# Patient Record
Sex: Female | Born: 1935 | Race: White | Hispanic: No | Smoking: Never smoker
Health system: Southern US, Community
[De-identification: ages and names within clinical notes are randomized; demographics above are authoritative.]

## PROBLEM LIST (undated history)

## (undated) DIAGNOSIS — I499 Cardiac arrhythmia, unspecified: Secondary | ICD-10-CM

## (undated) DIAGNOSIS — Z87442 Personal history of urinary calculi: Secondary | ICD-10-CM

## (undated) DIAGNOSIS — F32A Depression, unspecified: Secondary | ICD-10-CM

## (undated) DIAGNOSIS — Z950 Presence of cardiac pacemaker: Secondary | ICD-10-CM

## (undated) DIAGNOSIS — Z952 Presence of prosthetic heart valve: Secondary | ICD-10-CM

## (undated) DIAGNOSIS — F419 Anxiety disorder, unspecified: Secondary | ICD-10-CM

## (undated) DIAGNOSIS — G629 Polyneuropathy, unspecified: Secondary | ICD-10-CM

## (undated) DIAGNOSIS — G610 Guillain-Barre syndrome: Secondary | ICD-10-CM

## (undated) DIAGNOSIS — F329 Major depressive disorder, single episode, unspecified: Secondary | ICD-10-CM

## (undated) DIAGNOSIS — G43909 Migraine, unspecified, not intractable, without status migrainosus: Secondary | ICD-10-CM

## (undated) DIAGNOSIS — I10 Essential (primary) hypertension: Secondary | ICD-10-CM

## (undated) DIAGNOSIS — I359 Nonrheumatic aortic valve disorder, unspecified: Secondary | ICD-10-CM

## (undated) DIAGNOSIS — I509 Heart failure, unspecified: Secondary | ICD-10-CM

## (undated) DIAGNOSIS — I38 Endocarditis, valve unspecified: Secondary | ICD-10-CM

## (undated) DIAGNOSIS — E119 Type 2 diabetes mellitus without complications: Secondary | ICD-10-CM

## (undated) DIAGNOSIS — R0600 Dyspnea, unspecified: Secondary | ICD-10-CM

## (undated) DIAGNOSIS — E785 Hyperlipidemia, unspecified: Secondary | ICD-10-CM

## (undated) DIAGNOSIS — E039 Hypothyroidism, unspecified: Secondary | ICD-10-CM

## (undated) HISTORY — PX: INSERT / REPLACE / REMOVE PACEMAKER: SUR710

## (undated) HISTORY — PX: EYE SURGERY: SHX253

## (undated) HISTORY — PX: PACEMAKER INSERTION: SHX728

## (undated) HISTORY — PX: CARDIAC SURGERY: SHX584

## (undated) HISTORY — PX: ABDOMINAL HYSTERECTOMY: SHX81

## (undated) HISTORY — PX: CARDIAC VALVE REPLACEMENT: SHX585

## (undated) HISTORY — PX: TONSILLECTOMY: SUR1361

## (undated) HISTORY — PX: CATARACT EXTRACTION: SUR2

---

## 2000-01-18 DIAGNOSIS — G610 Guillain-Barre syndrome: Secondary | ICD-10-CM

## 2000-01-18 HISTORY — DX: Guillain-Barre syndrome: G61.0

## 2010-04-19 HISTORY — PX: PACEMAKER INSERTION: SHX728

## 2014-10-31 ENCOUNTER — Emergency Department: Payer: Medicare Other

## 2014-10-31 ENCOUNTER — Emergency Department
Admission: EM | Admit: 2014-10-31 | Discharge: 2014-10-31 | Disposition: A | Discharge status: Home / Self Care | Payer: Medicare Other | Attending: Emergency Medicine | Admitting: Emergency Medicine

## 2014-10-31 ENCOUNTER — Encounter: Payer: Self-pay | Admitting: Emergency Medicine

## 2014-10-31 DIAGNOSIS — Z880 Allergy status to penicillin: Secondary | ICD-10-CM | POA: Insufficient documentation

## 2014-10-31 DIAGNOSIS — J40 Bronchitis, not specified as acute or chronic: Secondary | ICD-10-CM | POA: Diagnosis not present

## 2014-10-31 DIAGNOSIS — Z79899 Other long term (current) drug therapy: Secondary | ICD-10-CM | POA: Diagnosis not present

## 2014-10-31 DIAGNOSIS — R05 Cough: Secondary | ICD-10-CM | POA: Diagnosis present

## 2014-10-31 DIAGNOSIS — E119 Type 2 diabetes mellitus without complications: Secondary | ICD-10-CM | POA: Diagnosis not present

## 2014-10-31 DIAGNOSIS — Z7901 Long term (current) use of anticoagulants: Secondary | ICD-10-CM | POA: Diagnosis not present

## 2014-10-31 HISTORY — DX: Type 2 diabetes mellitus without complications: E11.9

## 2014-10-31 LAB — COMPREHENSIVE METABOLIC PANEL
ALBUMIN: 4.3 g/dL (ref 3.5–5.0)
ALT: 23 U/L (ref 14–54)
AST: 25 U/L (ref 15–41)
Alkaline Phosphatase: 104 U/L (ref 38–126)
Anion gap: 6 (ref 5–15)
BUN: 15 mg/dL (ref 6–20)
CO2: 29 mmol/L (ref 22–32)
CREATININE: 0.89 mg/dL (ref 0.44–1.00)
Calcium: 9.2 mg/dL (ref 8.9–10.3)
Chloride: 104 mmol/L (ref 101–111)
GFR calc non Af Amer: >60 mL/min (ref >60)
GLUCOSE: 127 mg/dL — AB (ref 65–99)
Potassium: 4 mmol/L (ref 3.5–5.1)
SODIUM: 139 mmol/L (ref 135–145)
TOTAL PROTEIN: 7.5 g/dL (ref 6.5–8.1)
Total Bilirubin: 1.2 mg/dL (ref 0.3–1.2)

## 2014-10-31 LAB — BRAIN NATRIURETIC PEPTIDE: B Natriuretic Peptide: 189 pg/mL — ABNORMAL HIGH (ref 0.0–100.0)

## 2014-10-31 LAB — CBC
HEMATOCRIT: 40.2 % (ref 35.0–47.0)
Hemoglobin: 13.5 g/dL (ref 12.0–16.0)
MCH: 30.9 pg (ref 26.0–34.0)
MCHC: 33.7 g/dL (ref 32.0–36.0)
MCV: 91.6 fL (ref 80.0–100.0)
PLATELETS: 149 10*3/uL — AB (ref 150–440)
RBC: 4.39 MIL/uL (ref 3.80–5.20)
RDW: 16.5 % — ABNORMAL HIGH (ref 11.5–14.5)
WBC: 8 10*3/uL (ref 3.6–11.0)

## 2014-10-31 LAB — TROPONIN I: Troponin I: 0.05 ng/mL — ABNORMAL HIGH (ref <0.031)

## 2014-10-31 MED ORDER — ALBUTEROL SULFATE (2.5 MG/3ML) 0.083% IN NEBU
2.5000 mg | INHALATION_SOLUTION | Freq: Once | RESPIRATORY_TRACT | Status: AC
  Administered 2014-10-31: 2.5 mg via RESPIRATORY_TRACT
  Filled 2014-10-31: qty 3

## 2014-10-31 MED ORDER — ALBUTEROL SULFATE HFA 108 (90 BASE) MCG/ACT IN AERS: 2.0000 | INHALATION_SPRAY | RESPIRATORY_TRACT | Status: DC | PRN

## 2014-10-31 MED ORDER — PREDNISONE 50 MG PO TABS: 50.0000 mg | ORAL_TABLET | Freq: Every day | ORAL | Status: AC

## 2014-10-31 MED ORDER — IPRATROPIUM-ALBUTEROL 0.5-2.5 (3) MG/3ML IN SOLN
3.0000 mL | Freq: Once | RESPIRATORY_TRACT | Status: AC
  Administered 2014-10-31: 3 mL via RESPIRATORY_TRACT
  Filled 2014-10-31: qty 3

## 2014-10-31 NOTE — ED Provider Notes (Signed)
[facility] Emergency Department Provider Note  ____________________________________________  Time seen: On arrival  I have reviewed the triage vital signs and the nursing notes.   HISTORY  Chief Complaint Cough    HPI Jodi Waters is a 80 y.o. female who complains of cough for approximately one week. It is most severe at night. And sometimes leaves her feeling more short of breath than her baseline. She reports she went to urgent care several days ago and they gave her a shot of steroid's and azithromycin which may have briefly improved her symptoms but now they've returned. She denies chest pain. She denies pleurisy. She denies leg swelling. She denies fevers or chills. No nausea no vomiting, no diaphoresis.     Past Medical History  Diagnosis Date  . Diabetes mellitus without complication     There are no active problems to display for this patient.   Past Surgical History  Procedure Laterality Date  . Pacemaker insertion    . Cardiac surgery      Current Outpatient Rx  Name  Route  Sig  Dispense  Refill  . digoxin (LANOXIN) 0.125 MG tablet   Oral   Take 1 mg by mouth every morning.         . furosemide (LASIX) 40 MG tablet   Oral   Take 40 mg by mouth 2 (two) times daily.         Marland Kitchen gabapentin (NEURONTIN) 100 MG capsule   Oral   Take 200 mg by mouth every evening.         Marland Kitchen levothyroxine (SYNTHROID, LEVOTHROID) 50 MCG tablet   Oral   Take 50 mcg by mouth daily before breakfast.         . losartan (COZAAR) 25 MG tablet   Oral   Take 25 mg by mouth daily.         Marland Kitchen PARoxetine (PAXIL) 20 MG tablet   Oral   Take 20 mg by mouth every evening.         . simvastatin (ZOCOR) 20 MG tablet   Oral   Take 20 mg by mouth every evening.         . verapamil (VERELAN PM) 180 MG 24 hr capsule   Oral   Take 180 mg by mouth daily.         Marland Kitchen warfarin (COUMADIN) 4 MG tablet   Oral   Take 5 mg by mouth daily.         Marland Kitchen  albuterol (PROVENTIL HFA;VENTOLIN HFA) 108 (90 BASE) MCG/ACT inhaler   Inhalation   Inhale 2 puffs into the lungs every 4 (four) hours as needed for wheezing or shortness of breath.   1 Inhaler   2   . predniSONE (DELTASONE) 50 MG tablet   Oral   Take 1 tablet (50 mg total) by mouth daily with breakfast.   5 tablet   0     Allergies Penicillins  History reviewed. No pertinent family history.  Social History History  Substance Use Topics  . Smoking status: Never Smoker   . Smokeless tobacco: Not on file  . Alcohol Use: No    Review of Systems  Constitutional: Negative for fever. Eyes: Negative for visual changes. ENT: Negative for sore throat Cardiovascular: Negative for chest pain. Respiratory: Shortness of breath, positive cough Gastrointestinal: Negative for abdominal pain, vomiting and diarrhea. Genitourinary: Negative for dysuria. Musculoskeletal: Negative for back pain. Skin: Negative for rash. Neurological: Negative for headaches or  focal weakness Psychiatric: No anxiety  10-point ROS otherwise negative.  ____________________________________________   PHYSICAL EXAM:  VITAL SIGNS: ED Triage Vitals  Enc Vitals Group     BP 10/31/14 1106 129/68 mmHg     Pulse Rate 10/31/14 1106 87     Resp 10/31/14 1106 18     Temp 10/31/14 1106 98.4 F (36.9 C)     Temp Source 10/31/14 1106 Oral     SpO2 10/31/14 1106 95 %     Weight 10/31/14 1106 160 lb (72.576 kg)     Height 10/31/14 1106 5\' 4"  (1.626 m)     Head Cir --      Peak Flow --      Pain Score --      Pain Loc --      Pain Edu? --      Excl. in [place]? --      Constitutional: Alert and oriented. Well appearing and in no distress. Eyes: Conjunctivae are normal.  ENT   Head: Normocephalic and atraumatic.   Mouth/Throat: Mucous membranes are moist. Cardiovascular: Normal rate, regular rhythm. Normal and symmetric distal pulses are present in all extremities. No murmurs, rubs, or  gallops. Respiratory: Normal respiratory effort without tachypnea nor retractions. Scattered mild wheezes Gastrointestinal: Soft and non-tender in all quadrants. No distention. There is no CVA tenderness. Genitourinary: deferred Musculoskeletal: Nontender with normal range of motion in all extremities. No lower extremity tenderness nor edema. Neurologic:  Normal speech and language. No gross focal neurologic deficits are appreciated. Skin:  Skin is warm, dry and intact. No rash noted. Psychiatric: Mood and affect are normal. Patient exhibits appropriate insight and judgment.  ____________________________________________    LABS (pertinent positives/negatives)  Labs Reviewed  CBC - Abnormal; Notable for the following:    RDW 16.5 (*)    Platelets 149 (*)    All other components within normal limits  COMPREHENSIVE METABOLIC PANEL - Abnormal; Notable for the following:    Glucose, Bld 127 (*)    All other components within normal limits  TROPONIN I - Abnormal; Notable for the following:    Troponin I 0.05 (*)    All other components within normal limits  BRAIN NATRIURETIC PEPTIDE - Abnormal; Notable for the following:    B Natriuretic Peptide 189.0 (*)    All other components within normal limits    ____________________________________________   EKG  None  ____________________________________________    RADIOLOGY I have personally reviewed any xrays that were ordered on this patient: Chest x-ray unremarkable  ____________________________________________   PROCEDURES  Procedure(s) performed: none  Critical Care performed: none  ____________________________________________   INITIAL IMPRESSION / ASSESSMENT AND PLAN / ED COURSE  Pertinent labs & imaging results that were available during my care of the patient were reviewed by me and considered in my medical decision making (see chart for details).  Workup unremarkable except for mildly elevated opponent and  mildly elevated BNP. Patient with chronic shortness of breath which is likely the cause of slight elevation in troponin as she has no chest pain and her EKG isn't concerning. I recommended rechecking a troponin and d-dimer but after waiting for lab work to return patient is impatient to leave. I feel like this is reasonable given that she has no chest pain and this is not a concerning story for ACS or for PE however the patient does agree to return if symptoms worsen. We will treat this is likely viral bronchitis with steroid's albuterol inhaler and PCP follow-up.  ____________________________________________   FINAL CLINICAL IMPRESSION(S) / ED DIAGNOSES  Final diagnoses:  Bronchitis     Lavonia Drafts, MD 10/31/14 [phone]

## 2014-10-31 NOTE — Discharge Instructions (Signed)
Upper Respiratory Infection, Adult An upper respiratory infection (URI) is also sometimes known as the common cold. The upper respiratory tract includes the nose, sinuses, throat, trachea, and bronchi. Bronchi are the airways leading to the lungs. Most people improve within 1 week, but symptoms can last up to 2 weeks. A residual cough may last even longer.  CAUSES Many different viruses can infect the tissues lining the upper respiratory tract. The tissues become irritated and inflamed and often become very moist. Mucus production is also common. A cold is contagious. You can easily spread the virus to others by oral contact. This includes kissing, sharing a glass, coughing, or sneezing. Touching your mouth or nose and then touching a surface, which is then touched by another person, can also spread the virus. SYMPTOMS  Symptoms typically develop 1 to 3 days after you come in contact with a cold virus. Symptoms vary from person to person. They may include:  Runny nose.  Sneezing.  Nasal congestion.  Sinus irritation.  Sore throat.  Loss of voice (laryngitis).  Cough.  Fatigue.  Muscle aches.  Loss of appetite.  Headache.  Low-grade fever. DIAGNOSIS  You might diagnose your own cold based on familiar symptoms, since most people get a cold 2 to 3 times a year. Your caregiver can confirm this based on your exam. Most importantly, your caregiver can check that your symptoms are not due to another disease such as strep throat, sinusitis, pneumonia, asthma, or epiglottitis. Blood tests, throat tests, and X-rays are not necessary to diagnose a common cold, but they may sometimes be helpful in excluding other more serious diseases. Your caregiver will decide if any further tests are required. RISKS AND COMPLICATIONS  You may be at risk for a more severe case of the common cold if you smoke cigarettes, have chronic heart disease (such as heart failure) or lung disease (such as asthma), or if  you have a weakened immune system. The very young and very old are also at risk for more serious infections. Bacterial sinusitis, middle ear infections, and bacterial pneumonia can complicate the common cold. The common cold can worsen asthma and chronic obstructive pulmonary disease (COPD). Sometimes, these complications can require emergency medical care and may be life-threatening. PREVENTION  The best way to protect against getting a cold is to practice good hygiene. Avoid oral or hand contact with people with cold symptoms. Wash your hands often if contact occurs. There is no clear evidence that vitamin C, vitamin E, echinacea, or exercise reduces the chance of developing a cold. However, it is always recommended to get plenty of rest and practice good nutrition. TREATMENT  Treatment is directed at relieving symptoms. There is no cure. Antibiotics are not effective, because the infection is caused by a virus, not by bacteria. Treatment may include:  Increased fluid intake. Sports drinks offer valuable electrolytes, sugars, and fluids.  Breathing heated mist or steam (vaporizer or shower).  Eating chicken soup or other clear broths, and maintaining good nutrition.  Getting plenty of rest.  Using gargles or lozenges for comfort.  Controlling fevers with ibuprofen or acetaminophen as directed by your caregiver.  Increasing usage of your inhaler if you have asthma. Zinc gel and zinc lozenges, taken in the first 24 hours of the common cold, can shorten the duration and lessen the severity of symptoms. Pain medicines may help with fever, muscle aches, and throat pain. A variety of non-prescription medicines are available to treat congestion and runny nose. Your caregiver   can make recommendations and may suggest nasal or lung inhalers for other symptoms.  HOME CARE INSTRUCTIONS   Only take over-the-counter or prescription medicines for pain, discomfort, or fever as directed by your  caregiver.  Use a warm mist humidifier or inhale steam from a shower to increase air moisture. This may keep secretions moist and make it easier to breathe.  Drink enough water and fluids to keep your urine clear or pale yellow.  Rest as needed.  Return to work when your temperature has returned to normal or as your caregiver advises. You may need to stay home longer to avoid infecting others. You can also use a face mask and careful hand washing to prevent spread of the virus. SEEK MEDICAL CARE IF:   After the first few days, you feel you are getting worse rather than better.  You need your caregiver's advice about medicines to control symptoms.  You develop chills, worsening shortness of breath, or brown or red sputum. These may be signs of pneumonia.  You develop yellow or brown nasal discharge or pain in the face, especially when you bend forward. These may be signs of sinusitis.  You develop a fever, swollen neck glands, pain with swallowing, or white areas in the back of your throat. These may be signs of strep throat. SEEK IMMEDIATE MEDICAL CARE IF:   You have a fever.  You develop severe or persistent headache, ear pain, sinus pain, or chest pain.  You develop wheezing, a prolonged cough, cough up blood, or have a change in your usual mucus (if you have chronic lung disease).  You develop sore muscles or a stiff neck. Document Released: 09/29/2000 Document Revised: 06/28/2011 Document Reviewed: 07/11/2013 Exit[employer] Patient Information 2015 [employer]. This information is not intended to replace advice given to you by your health care provider. Make sure you discuss any questions you have with your health care provider.  

## 2014-10-31 NOTE — ED Notes (Signed)
Reports cough and cold sx x 2 wks.  Last week started antibiotic and prednisone.  Not better.  No resp distress at this time

## 2014-10-31 NOTE — ED Notes (Signed)
MD notified Trop 0.05.

## 2014-10-31 NOTE — ED Notes (Signed)
Pt discharged home after verbalizing understanding of discharge instructions; nad noted. 

## 2014-12-04 ENCOUNTER — Other Ambulatory Visit: Payer: Self-pay | Admitting: Family Medicine

## 2014-12-04 DIAGNOSIS — Z1231 Encounter for screening mammogram for malignant neoplasm of breast: Secondary | ICD-10-CM

## 2015-01-02 ENCOUNTER — Ambulatory Visit
Admission: RE | Admit: 2015-01-02 | Discharge: 2015-01-02 | Disposition: A | Discharge status: Home / Self Care | Payer: Medicare Other | Source: Ambulatory Visit | Attending: Family Medicine | Admitting: Family Medicine

## 2015-01-02 DIAGNOSIS — R921 Mammographic calcification found on diagnostic imaging of breast: Secondary | ICD-10-CM | POA: Insufficient documentation

## 2015-01-02 DIAGNOSIS — Z1231 Encounter for screening mammogram for malignant neoplasm of breast: Secondary | ICD-10-CM

## 2015-01-08 ENCOUNTER — Other Ambulatory Visit: Payer: Self-pay | Admitting: Family Medicine

## 2015-01-08 DIAGNOSIS — R928 Other abnormal and inconclusive findings on diagnostic imaging of breast: Secondary | ICD-10-CM

## 2015-01-15 ENCOUNTER — Ambulatory Visit
Admission: RE | Admit: 2015-01-15 | Discharge: 2015-01-15 | Disposition: A | Discharge status: Home / Self Care | Payer: Medicare Other | Source: Ambulatory Visit | Attending: Family Medicine | Admitting: Family Medicine

## 2015-01-15 ENCOUNTER — Other Ambulatory Visit: Payer: Self-pay | Admitting: Family Medicine

## 2015-01-15 DIAGNOSIS — R921 Mammographic calcification found on diagnostic imaging of breast: Secondary | ICD-10-CM | POA: Insufficient documentation

## 2015-01-15 DIAGNOSIS — R928 Other abnormal and inconclusive findings on diagnostic imaging of breast: Secondary | ICD-10-CM | POA: Diagnosis present

## 2015-01-16 ENCOUNTER — Other Ambulatory Visit: Payer: Self-pay | Admitting: Family Medicine

## 2015-01-16 DIAGNOSIS — R921 Mammographic calcification found on diagnostic imaging of breast: Secondary | ICD-10-CM

## 2015-01-23 ENCOUNTER — Ambulatory Visit
Admission: RE | Admit: 2015-01-23 | Discharge: 2015-01-23 | Disposition: A | Discharge status: Home / Self Care | Payer: Medicare Other | Source: Ambulatory Visit | Attending: Family Medicine | Admitting: Family Medicine

## 2015-01-23 DIAGNOSIS — D249 Benign neoplasm of unspecified breast: Secondary | ICD-10-CM | POA: Diagnosis not present

## 2015-01-23 DIAGNOSIS — R921 Mammographic calcification found on diagnostic imaging of breast: Secondary | ICD-10-CM

## 2015-01-24 LAB — SURGICAL PATHOLOGY

## 2015-04-30 DIAGNOSIS — I4430 Unspecified atrioventricular block: Secondary | ICD-10-CM | POA: Insufficient documentation

## 2015-04-30 DIAGNOSIS — I359 Nonrheumatic aortic valve disorder, unspecified: Secondary | ICD-10-CM | POA: Insufficient documentation

## 2015-04-30 DIAGNOSIS — I059 Rheumatic mitral valve disease, unspecified: Secondary | ICD-10-CM | POA: Insufficient documentation

## 2015-06-05 ENCOUNTER — Ambulatory Visit
Admission: RE | Admit: 2015-06-05 | Discharge: 2015-06-05 | Disposition: A | Discharge status: Home / Self Care | Payer: Medicare Other | Source: Ambulatory Visit | Attending: Cardiology | Admitting: Cardiology

## 2015-06-05 ENCOUNTER — Encounter
Admission: RE | Disposition: A | Discharge status: Home / Self Care | Payer: Self-pay | Source: Ambulatory Visit | Attending: Cardiology

## 2015-06-05 DIAGNOSIS — Z950 Presence of cardiac pacemaker: Secondary | ICD-10-CM | POA: Insufficient documentation

## 2015-06-05 DIAGNOSIS — Z825 Family history of asthma and other chronic lower respiratory diseases: Secondary | ICD-10-CM | POA: Insufficient documentation

## 2015-06-05 DIAGNOSIS — G610 Guillain-Barre syndrome: Secondary | ICD-10-CM | POA: Insufficient documentation

## 2015-06-05 DIAGNOSIS — I481 Persistent atrial fibrillation: Secondary | ICD-10-CM | POA: Diagnosis not present

## 2015-06-05 DIAGNOSIS — Z8669 Personal history of other diseases of the nervous system and sense organs: Secondary | ICD-10-CM | POA: Diagnosis not present

## 2015-06-05 DIAGNOSIS — R0602 Shortness of breath: Secondary | ICD-10-CM | POA: Diagnosis present

## 2015-06-05 DIAGNOSIS — Z9842 Cataract extraction status, left eye: Secondary | ICD-10-CM | POA: Insufficient documentation

## 2015-06-05 DIAGNOSIS — Z9841 Cataract extraction status, right eye: Secondary | ICD-10-CM | POA: Insufficient documentation

## 2015-06-05 DIAGNOSIS — E119 Type 2 diabetes mellitus without complications: Secondary | ICD-10-CM | POA: Diagnosis not present

## 2015-06-05 DIAGNOSIS — F329 Major depressive disorder, single episode, unspecified: Secondary | ICD-10-CM | POA: Diagnosis not present

## 2015-06-05 DIAGNOSIS — Z952 Presence of prosthetic heart valve: Secondary | ICD-10-CM | POA: Diagnosis not present

## 2015-06-05 DIAGNOSIS — I279 Pulmonary heart disease, unspecified: Secondary | ICD-10-CM | POA: Diagnosis present

## 2015-06-05 DIAGNOSIS — Z79899 Other long term (current) drug therapy: Secondary | ICD-10-CM | POA: Diagnosis not present

## 2015-06-05 DIAGNOSIS — Z7901 Long term (current) use of anticoagulants: Secondary | ICD-10-CM | POA: Insufficient documentation

## 2015-06-05 DIAGNOSIS — I272 Other secondary pulmonary hypertension: Secondary | ICD-10-CM | POA: Diagnosis not present

## 2015-06-05 DIAGNOSIS — E785 Hyperlipidemia, unspecified: Secondary | ICD-10-CM | POA: Diagnosis not present

## 2015-06-05 DIAGNOSIS — G629 Polyneuropathy, unspecified: Secondary | ICD-10-CM | POA: Insufficient documentation

## 2015-06-05 DIAGNOSIS — E079 Disorder of thyroid, unspecified: Secondary | ICD-10-CM | POA: Insufficient documentation

## 2015-06-05 DIAGNOSIS — I080 Rheumatic disorders of both mitral and aortic valves: Secondary | ICD-10-CM | POA: Insufficient documentation

## 2015-06-05 DIAGNOSIS — Z880 Allergy status to penicillin: Secondary | ICD-10-CM | POA: Insufficient documentation

## 2015-06-05 DIAGNOSIS — I4891 Unspecified atrial fibrillation: Secondary | ICD-10-CM | POA: Insufficient documentation

## 2015-06-05 DIAGNOSIS — Z90710 Acquired absence of both cervix and uterus: Secondary | ICD-10-CM | POA: Diagnosis not present

## 2015-06-05 HISTORY — DX: Guillain-Barre syndrome: G61.0

## 2015-06-05 HISTORY — DX: Depression, unspecified: F32.A

## 2015-06-05 HISTORY — PX: CARDIAC CATHETERIZATION: SHX172

## 2015-06-05 HISTORY — DX: Hypothyroidism, unspecified: E03.9

## 2015-06-05 HISTORY — DX: Polyneuropathy, unspecified: G62.9

## 2015-06-05 HISTORY — DX: Major depressive disorder, single episode, unspecified: F32.9

## 2015-06-05 HISTORY — DX: Cardiac arrhythmia, unspecified: I49.9

## 2015-06-05 HISTORY — DX: Migraine, unspecified, not intractable, without status migrainosus: G43.909

## 2015-06-05 HISTORY — DX: Hyperlipidemia, unspecified: E78.5

## 2015-06-05 LAB — PROTIME-INR
INR: 1.24
Prothrombin Time: 15.8 seconds — ABNORMAL HIGH (ref 11.4–15.0)

## 2015-06-05 SURGERY — RIGHT HEART CATH
Anesthesia: Moderate Sedation | Wound class: Clean

## 2015-06-05 MED ORDER — SODIUM CHLORIDE 0.9 % IV SOLN
INTRAVENOUS | Status: DC
  Administered 2015-06-05: 08:00:00 via INTRAVENOUS

## 2015-06-05 MED ORDER — MIDAZOLAM HCL 2 MG/2ML IJ SOLN
INTRAMUSCULAR | Status: DC | PRN
  Administered 2015-06-05: 1 mg via INTRAVENOUS

## 2015-06-05 MED ORDER — MIDAZOLAM HCL 2 MG/2ML IJ SOLN
INTRAMUSCULAR | Status: AC
  Filled 2015-06-05: qty 2

## 2015-06-05 MED ORDER — HEPARIN (PORCINE) IN NACL 2-0.9 UNIT/ML-% IJ SOLN
INTRAMUSCULAR | Status: AC
  Filled 2015-06-05: qty 500

## 2015-06-05 SURGICAL SUPPLY — 11 items
CATH INFINITI 5FR JL4 (CATHETERS) IMPLANT
CATH INFINITI JR4 5F (CATHETERS) IMPLANT
CATH SWANZ 7F THERMO (CATHETERS) ×3 IMPLANT
GUIDEWIRE EMER 3M J .025X150CM (WIRE) ×3 IMPLANT
KIT MANI 3VAL PERCEP (MISCELLANEOUS) ×3 IMPLANT
KIT RIGHT HEART (MISCELLANEOUS) ×3 IMPLANT
NEEDLE PERC 18GX7CM (NEEDLE) ×3 IMPLANT
PACK CARDIAC CATH (CUSTOM PROCEDURE TRAY) ×3 IMPLANT
SHEATH PINNACLE 5F 10CM (SHEATH) IMPLANT
SHEATH PINNACLE 7F 10CM (SHEATH) ×3 IMPLANT
WIRE EMERALD 3MM-J .035X150CM (WIRE) IMPLANT

## 2015-06-05 NOTE — Discharge Instructions (Signed)

## 2015-06-05 NOTE — H&P (Signed)
Chief Complaint: Chief Complaint  Patient presents with  . 4 week follow up  echo  Date of Service: 05/22/2015 Date of Birth: [birth year] PCP: Macie Burows, MD  History of Present Illness: Ms. Criscione is a 81 y.o.female patient who referred for new patient evaluation for atrial fibrillation. She had aortic and mitral valve replaced in 1989. Her aortic valve apparently was replaced again in [place] in 2014. She has a permanent pacemaker with the most recent generator change out in 2012. She is anticoagulated with warfarin and has rate controlled verapamil and digoxin. She denies any excessive bleeding or bruising. She is on simvastatin for hyperlipidemia. Her thyroid is controlled with levothyroxine at 50 mcg daily. She uses furosemide for volume control. The patient has noticed progress in of shortness of breath. A cardiogram was completed which revealed preserved LV function with an adequately functioning prosthetic aortic valve in an adequately functioning mitral prosthetic valve. There is severe TR with estimated right ventricular systolic pressure is [id number] mmHg. She is now being seen back for consideration for right heart cath to better evaluate her pulmonary pressures to guide further therapy. Past Medical and Surgical History  Past Medical History Past Medical History  Diagnosis Date  . A-fib , unspecified (CMS-HCC)  . Depression, unspecified  . Diabetes mellitus type 2, uncomplicated (CMS-HCC)  . Guillain-Barre (CMS-HCC)  . Hyperlipidemia, unspecified  . Migraines  . Peripheral neuropathy (CMS-HCC)  . Thyroid disease   Past Surgical History She has a past surgical history that includes Hysterectomy; Tonsillectomy; Replacement Mitral Valve (1989); Replacement Aortic Valve (2014); Cataract extraction (Bilateral, [id number]); Cardiac valve replacement; and Insert / replace / remove pacemaker.   Medications and Allergies  Current Medications  Current Outpatient Prescriptions   Medication Sig Dispense Refill  . digoxin (LANOXIN) 0.125 MG tablet Take 1 tablet (0.125 mg total) by mouth once daily. 90 tablet 1  . FUROsemide (LASIX) 40 MG tablet Take 40 mg by mouth 2 (two) times daily.  Marland Kitchen gabapentin (NEURONTIN) 100 MG capsule Take 2 capsules in the pm 180 capsule 1  . levothyroxine (SYNTHROID, LEVOTHROID) 50 MCG tablet Take 1 tablet (50 mcg total) by mouth once daily. Take on an empty stomach with a glass of water at least 30-60 minutes before breakfast. 90 tablet 1  . PARoxetine (PAXIL) 20 MG tablet Take 1 tablet (20 mg total) by mouth once daily. 90 tablet 1  . simvastatin (ZOCOR) 20 MG tablet Take 20 mg by mouth nightly.  . verapamil (CALAN-SR) 180 MG SR tablet Take 180 mg by mouth once daily.  Marland Kitchen warfarin (COUMADIN) 1 MG tablet Take 1 tablet (1 mg total) by mouth once daily. 90 tablet 3  . warfarin (COUMADIN) 4 MG tablet Take 1 tablet (4 mg total) by mouth once daily. 90 tablet 3   No current facility-administered medications for this visit.   Allergies: Penicillin  Social and Family History  Social History reports that she has never smoked. She has never used smokeless tobacco. Alcohol use questions deferred to the physician. She reports that she does not use illicit drugs.  Family History Family History  Problem Relation Age of Onset  . Emphysema Mother  . Emphysema Father   Review of Systems  Review of Systems  Constitutional: Negative for chills, diaphoresis, fever, malaise/fatigue and weight loss.  HENT: Negative for congestion, ear discharge, hearing loss and tinnitus.  Eyes: Negative for blurred vision.  Respiratory: Positive for shortness of breath. Negative for cough, hemoptysis, sputum production and wheezing.  Cardiovascular: Positive for leg swelling. Negative for chest pain, palpitations, orthopnea, claudication and PND.  Gastrointestinal: Negative for abdominal pain, blood in stool, constipation, diarrhea, heartburn, melena, nausea and  vomiting.  Genitourinary: Negative for dysuria, frequency, hematuria and urgency.  Musculoskeletal: Negative for back pain, falls, joint pain and myalgias.  Skin: Negative for itching and rash.  Neurological: Negative for dizziness, tingling, focal weakness, loss of consciousness, weakness and headaches.  Endo/Heme/Allergies: Negative for polydipsia. Does not bruise/bleed easily.  Psychiatric/Behavioral: Negative for depression, memory loss and substance abuse. The patient is not nervous/anxious.    Physical Examination   Vitals: Visit Vitals  . BP 130/60 (BP Location: Left upper arm, Patient Position: Sitting, BP Cuff Size: Adult)  . Pulse 92  . Resp 12  . Ht 160 cm (5\' 3" )  . Wt 78.9 kg (174 lb)  . BMI 30.82 kg/m2   Ht:160 cm (5\' 3" ) Wt:78.9 kg (174 lb) [id number] surface area is 1.87 meters squared. Body mass index is 30.82 kg/(m^2).  Wt Readings from Last 3 Encounters:  05/26/15 79.4 kg (175 lb)  05/22/15 78.9 kg (174 lb)  05/08/15 78 kg (172 lb)   BP Readings from Last 3 Encounters:  05/26/15 122/50  05/22/15 130/60  05/08/15 132/61   General appearance appears in no acute distress  Head Mouth and Eye exam Normocephalic, without obvious abnormality, atraumatic Dentition is good Eyes appear anicteric   Neck exam Thyroid: normal  Nodes: no obvious adenopathy  LUNGS Breath Sounds: Normal Percussion: Normal  CARDIOVASCULAR JVP CV wave: no HJR: no Elevation at 90 degrees: None Carotid Pulse: normal pulsation bilaterally Bruit: None Apex: apical impulse normal  Auscultation Rhythm: atrial fibrillation and normal pacemaker rhythm S1: normal prosthetic S1 S2: normal prosthetic S2 Clicks: no Rub: no Murmurs: 2/6 medium pitched crescendo-decrescendo blowing at lower left sternal border, at upper left sternal border, at upper right sternal border  Gallop: None ABDOMEN Liver enlargement: no Pulsatile aorta: no Ascites: no Bruits:  no  EXTREMITIES Clubbing: no Edema: 2+ bilateral pedal edema Pulses: peripheral pulses symmetrical Femoral Bruits: no Amputation: no SKIN Rash: no Cyanosis: no Embolic phemonenon: no Bruising: no NEURO Alert and Oriented to person, place and time: yes Non focal: yes  PSYCH: Pt appears to have normal affect  LABS REVIEWED Last 3 CBC results: Lab Results  Component Value Date  WBC 4.8 04/23/2015  WBC 4.9 12/05/2014   Lab Results  Component Value Date  HGB 12.1 04/23/2015  HGB 12.3 12/05/2014   Lab Results  Component Value Date  HCT 36.9 04/23/2015  HCT 37.7 12/05/2014   Lab Results  Component Value Date  PLT 178 04/23/2015  PLT 109 (L) 12/05/2014   Lab Results  Component Value Date  CREATININE 0.8 04/23/2015  BUN 14 04/23/2015  NA 138 04/23/2015  K 4.6 04/23/2015  CL 101 04/23/2015  CO2 28.6 04/23/2015   Lab Results  Component Value Date  HGBA1C 6.7 (H) 04/23/2015   Lab Results  Component Value Date  HDL 46.6 12/05/2014   Lab Results  Component Value Date  LDLCALC 73 12/05/2014   Lab Results  Component Value Date  TRIG 100 12/05/2014   Lab Results  Component Value Date  ALT 17 04/23/2015  AST 24 04/23/2015  ALKPHOS 108 (H) 04/23/2015   Lab Results  Component Value Date  TSH 1.757 05/26/2015   Diagnostic Studies Reviewed:  EKG EKG demonstrated atrial fibrillation, rate 64, Ventricular paced rhythm.  Assessment and Plan   81 y.o. female with  ICD-10-CM  ICD-9-CM  1. Paroxysmal a-fib-EKG has artifact but appears to show atrial fibrillation with ventricular paced rhythm. Patient is on warfarin with an INR goal of 2.5-3.5 for her valve. Will continue with this regimen. She has no history of tachy arrhythmias. Will continue with verapamil and digoxin for rate control. I48.0 427.31  2. Persistent atrial fibrillation-as per above I48.1 427.31  3. Hyperlipidemia, unspecified hyperlipidemia type-Will continue with simvastatin at 20 mg daily  with an LDL goal of less than 100. Most recent value was 73. Liver function appears normal. Will continue to follow E78.5 272.4  4. Thyroid disease-remain on levothyroxine. Most recent tsh was 0.692. E07.9 246.9  5. Heart block-atrial fibrillation with intermittent high-grade AV block. Has pacemaker backup. Generator was changed in 2012.   6. Aortic and mitral valve disease. Status post aortic mitral valve replacement. Has a metallic prosthesis. He is on anticoagulation with warfarin with a goal between 2.5 and 3.5. Does have more shortness of breath. Patient does have pulmonary hypertension by echo. We will proceed with a right heart cath to better evaluate pulmonary pressures to guide further therapy.  Return in about 4 weeks (around 06/19/2015).  These notes generated with voice recognition software. I apologize for typographical errors.  Sydnee Levans, MD

## 2015-06-12 ENCOUNTER — Other Ambulatory Visit: Payer: Self-pay | Admitting: Specialist

## 2015-06-12 DIAGNOSIS — R0609 Other forms of dyspnea: Principal | ICD-10-CM

## 2015-06-20 ENCOUNTER — Ambulatory Visit
Admission: RE | Admit: 2015-06-20 | Discharge: 2015-06-20 | Disposition: A | Discharge status: Home / Self Care | Payer: Medicare Other | Source: Ambulatory Visit | Attending: Specialist | Admitting: Specialist

## 2015-06-20 DIAGNOSIS — K7689 Other specified diseases of liver: Secondary | ICD-10-CM | POA: Diagnosis not present

## 2015-06-20 DIAGNOSIS — R599 Enlarged lymph nodes, unspecified: Secondary | ICD-10-CM | POA: Insufficient documentation

## 2015-06-20 DIAGNOSIS — N200 Calculus of kidney: Secondary | ICD-10-CM | POA: Insufficient documentation

## 2015-06-20 DIAGNOSIS — R0609 Other forms of dyspnea: Secondary | ICD-10-CM | POA: Diagnosis not present

## 2015-10-31 ENCOUNTER — Encounter: Payer: Self-pay | Admitting: Emergency Medicine

## 2015-10-31 ENCOUNTER — Ambulatory Visit: Payer: Medicare Other | Admitting: Anesthesiology

## 2015-10-31 ENCOUNTER — Ambulatory Visit
Admission: EM | Admit: 2015-10-31 | Discharge: 2015-10-31 | Disposition: A | Discharge status: Home / Self Care | Payer: Medicare Other | Attending: Emergency Medicine | Admitting: Emergency Medicine

## 2015-10-31 ENCOUNTER — Encounter
Admission: EM | Disposition: A | Discharge status: Home / Self Care | Payer: Self-pay | Source: Home / Self Care | Attending: Emergency Medicine

## 2015-10-31 ENCOUNTER — Emergency Department: Payer: Medicare Other

## 2015-10-31 ENCOUNTER — Ambulatory Visit: Payer: Medicare Other

## 2015-10-31 DIAGNOSIS — Z79891 Long term (current) use of opiate analgesic: Secondary | ICD-10-CM | POA: Diagnosis not present

## 2015-10-31 DIAGNOSIS — E785 Hyperlipidemia, unspecified: Secondary | ICD-10-CM | POA: Diagnosis not present

## 2015-10-31 DIAGNOSIS — Z79899 Other long term (current) drug therapy: Secondary | ICD-10-CM | POA: Insufficient documentation

## 2015-10-31 DIAGNOSIS — Z880 Allergy status to penicillin: Secondary | ICD-10-CM | POA: Diagnosis not present

## 2015-10-31 DIAGNOSIS — Z952 Presence of prosthetic heart valve: Secondary | ICD-10-CM | POA: Insufficient documentation

## 2015-10-31 DIAGNOSIS — G610 Guillain-Barre syndrome: Secondary | ICD-10-CM | POA: Insufficient documentation

## 2015-10-31 DIAGNOSIS — F329 Major depressive disorder, single episode, unspecified: Secondary | ICD-10-CM | POA: Diagnosis not present

## 2015-10-31 DIAGNOSIS — G43909 Migraine, unspecified, not intractable, without status migrainosus: Secondary | ICD-10-CM | POA: Diagnosis not present

## 2015-10-31 DIAGNOSIS — I272 Other secondary pulmonary hypertension: Secondary | ICD-10-CM | POA: Insufficient documentation

## 2015-10-31 DIAGNOSIS — I4891 Unspecified atrial fibrillation: Secondary | ICD-10-CM | POA: Diagnosis not present

## 2015-10-31 DIAGNOSIS — E039 Hypothyroidism, unspecified: Secondary | ICD-10-CM | POA: Diagnosis not present

## 2015-10-31 DIAGNOSIS — Z950 Presence of cardiac pacemaker: Secondary | ICD-10-CM | POA: Diagnosis not present

## 2015-10-31 DIAGNOSIS — S52502A Unspecified fracture of the lower end of left radius, initial encounter for closed fracture: Secondary | ICD-10-CM

## 2015-10-31 DIAGNOSIS — Z808 Family history of malignant neoplasm of other organs or systems: Secondary | ICD-10-CM | POA: Insufficient documentation

## 2015-10-31 DIAGNOSIS — Z7901 Long term (current) use of anticoagulants: Secondary | ICD-10-CM | POA: Insufficient documentation

## 2015-10-31 DIAGNOSIS — Z8781 Personal history of (healed) traumatic fracture: Secondary | ICD-10-CM

## 2015-10-31 DIAGNOSIS — E1142 Type 2 diabetes mellitus with diabetic polyneuropathy: Secondary | ICD-10-CM | POA: Insufficient documentation

## 2015-10-31 DIAGNOSIS — W19XXXA Unspecified fall, initial encounter: Secondary | ICD-10-CM | POA: Insufficient documentation

## 2015-10-31 DIAGNOSIS — Z9889 Other specified postprocedural states: Secondary | ICD-10-CM

## 2015-10-31 DIAGNOSIS — S52572A Other intraarticular fracture of lower end of left radius, initial encounter for closed fracture: Secondary | ICD-10-CM | POA: Insufficient documentation

## 2015-10-31 DIAGNOSIS — S52509A Unspecified fracture of the lower end of unspecified radius, initial encounter for closed fracture: Secondary | ICD-10-CM | POA: Diagnosis present

## 2015-10-31 DIAGNOSIS — Z90710 Acquired absence of both cervix and uterus: Secondary | ICD-10-CM | POA: Insufficient documentation

## 2015-10-31 DIAGNOSIS — S52602A Unspecified fracture of lower end of left ulna, initial encounter for closed fracture: Secondary | ICD-10-CM

## 2015-10-31 HISTORY — PX: ORIF WRIST FRACTURE: SHX2133

## 2015-10-31 LAB — CBC WITH DIFFERENTIAL/PLATELET
BASOS ABS: 0 10*3/uL (ref 0–0.1)
Basophils Relative: 0 %
Eosinophils Absolute: 0 10*3/uL (ref 0–0.7)
Eosinophils Relative: 0 %
HEMATOCRIT: 36.7 % (ref 35.0–47.0)
HEMOGLOBIN: 12.9 g/dL (ref 12.0–16.0)
LYMPHS ABS: 0.6 10*3/uL — AB (ref 1.0–3.6)
LYMPHS PCT: 6 %
MCH: 31.2 pg (ref 26.0–34.0)
MCHC: 35.1 g/dL (ref 32.0–36.0)
MCV: 88.8 fL (ref 80.0–100.0)
Monocytes Absolute: 1 10*3/uL — ABNORMAL HIGH (ref 0.2–0.9)
Monocytes Relative: 10 %
NEUTROS ABS: 8.4 10*3/uL — AB (ref 1.4–6.5)
NEUTROS PCT: 84 %
PLATELETS: 147 10*3/uL — AB (ref 150–440)
RBC: 4.13 MIL/uL (ref 3.80–5.20)
RDW: 16.4 % — ABNORMAL HIGH (ref 11.5–14.5)
WBC: 10 10*3/uL (ref 3.6–11.0)

## 2015-10-31 LAB — COMPREHENSIVE METABOLIC PANEL
ALK PHOS: 87 U/L (ref 38–126)
ALT: 20 U/L (ref 14–54)
AST: 37 U/L (ref 15–41)
Albumin: 3.7 g/dL (ref 3.5–5.0)
Anion gap: 8 (ref 5–15)
BUN: 14 mg/dL (ref 6–20)
CALCIUM: 8.8 mg/dL — AB (ref 8.9–10.3)
CHLORIDE: 104 mmol/L (ref 101–111)
CO2: 23 mmol/L (ref 22–32)
CREATININE: 0.65 mg/dL (ref 0.44–1.00)
Glucose, Bld: 193 mg/dL — ABNORMAL HIGH (ref 65–99)
Potassium: 4.4 mmol/L (ref 3.5–5.1)
Sodium: 135 mmol/L (ref 135–145)
Total Bilirubin: 2.1 mg/dL — ABNORMAL HIGH (ref 0.3–1.2)
Total Protein: 7.1 g/dL (ref 6.5–8.1)

## 2015-10-31 LAB — URINALYSIS COMPLETE WITH MICROSCOPIC (ARMC ONLY)
BILIRUBIN URINE: NEGATIVE
Glucose, UA: NEGATIVE mg/dL
Ketones, ur: NEGATIVE mg/dL
Leukocytes, UA: NEGATIVE
NITRITE: NEGATIVE
PH: 5 (ref 5.0–8.0)
PROTEIN: 30 mg/dL — AB
Specific Gravity, Urine: 1.019 (ref 1.005–1.030)

## 2015-10-31 LAB — PROTIME-INR
INR: 1.78
Prothrombin Time: 20.7 seconds — ABNORMAL HIGH (ref 11.4–15.0)

## 2015-10-31 LAB — GLUCOSE, CAPILLARY
GLUCOSE-CAPILLARY: 147 mg/dL — AB (ref 65–99)
GLUCOSE-CAPILLARY: 137 mg/dL — AB (ref 65–99)

## 2015-10-31 SURGERY — OPEN REDUCTION INTERNAL FIXATION (ORIF) WRIST FRACTURE
Anesthesia: General | Laterality: Left | Wound class: Clean

## 2015-10-31 MED ORDER — IPRATROPIUM-ALBUTEROL 0.5-2.5 (3) MG/3ML IN SOLN
RESPIRATORY_TRACT | Status: AC
  Filled 2015-10-31: qty 3

## 2015-10-31 MED ORDER — OXYCODONE-ACETAMINOPHEN 5-325 MG PO TABS
1.0000 | ORAL_TABLET | Freq: Once | ORAL | Status: AC
  Administered 2015-10-31: 1 via ORAL

## 2015-10-31 MED ORDER — NEOMYCIN-POLYMYXIN B GU 40-200000 IR SOLN
Status: DC | PRN
  Administered 2015-10-31: 2 mL

## 2015-10-31 MED ORDER — CLINDAMYCIN PHOSPHATE 900 MG/50ML IV SOLN: 900.0000 mg | INTRAVENOUS | Status: DC

## 2015-10-31 MED ORDER — SODIUM CHLORIDE 0.9 % IV SOLN
INTRAVENOUS | Status: DC
  Administered 2015-10-31: 14:00:00 via INTRAVENOUS

## 2015-10-31 MED ORDER — FENTANYL CITRATE (PF) 100 MCG/2ML IJ SOLN
INTRAMUSCULAR | Status: AC
  Filled 2015-10-31: qty 2

## 2015-10-31 MED ORDER — HYDROCODONE-ACETAMINOPHEN 5-325 MG PO TABS
1.0000 | ORAL_TABLET | Freq: Four times a day (QID) | ORAL | Status: AC | PRN
  Administered 2015-10-31: 1 via ORAL

## 2015-10-31 MED ORDER — HYDROCODONE-ACETAMINOPHEN 5-325 MG PO TABS
ORAL_TABLET | ORAL | Status: AC
  Administered 2015-10-31: 1 via ORAL
  Filled 2015-10-31: qty 1

## 2015-10-31 MED ORDER — HYDROCODONE-ACETAMINOPHEN 5-325 MG PO TABS: 1.0000 | ORAL_TABLET | Freq: Four times a day (QID) | ORAL | Status: DC | PRN

## 2015-10-31 MED ORDER — MIDAZOLAM HCL 2 MG/2ML IJ SOLN
INTRAMUSCULAR | Status: DC | PRN
  Administered 2015-10-31: .5 mg via INTRAVENOUS

## 2015-10-31 MED ORDER — ONDANSETRON HCL 4 MG/2ML IJ SOLN
4.0000 mg | Freq: Once | INTRAMUSCULAR | Status: DC | PRN
Start: 2015-10-31 — End: 2015-10-31

## 2015-10-31 MED ORDER — GLYCOPYRROLATE 0.2 MG/ML IJ SOLN
INTRAMUSCULAR | Status: DC | PRN
  Administered 2015-10-31: .2 mg via INTRAVENOUS

## 2015-10-31 MED ORDER — ONDANSETRON HCL 4 MG/2ML IJ SOLN
INTRAMUSCULAR | Status: DC | PRN
  Administered 2015-10-31: 4 mg via INTRAVENOUS

## 2015-10-31 MED ORDER — IPRATROPIUM-ALBUTEROL 0.5-2.5 (3) MG/3ML IN SOLN
3.0000 mL | Freq: Four times a day (QID) | RESPIRATORY_TRACT | Status: DC
  Administered 2015-10-31: 3 mL via RESPIRATORY_TRACT

## 2015-10-31 MED ORDER — LIDOCAINE HCL (CARDIAC) 20 MG/ML IV SOLN
INTRAVENOUS | Status: DC | PRN
  Administered 2015-10-31: 40 mg via INTRAVENOUS

## 2015-10-31 MED ORDER — FENTANYL CITRATE (PF) 100 MCG/2ML IJ SOLN
INTRAMUSCULAR | Status: DC | PRN
  Administered 2015-10-31 (×2): 25 ug via INTRAVENOUS
  Administered 2015-10-31: 50 ug via INTRAVENOUS
  Administered 2015-10-31: 25 ug via INTRAVENOUS

## 2015-10-31 MED ORDER — PROPOFOL 10 MG/ML IV BOLUS
INTRAVENOUS | Status: DC | PRN
  Administered 2015-10-31: 80 mg via INTRAVENOUS

## 2015-10-31 MED ORDER — FENTANYL CITRATE (PF) 100 MCG/2ML IJ SOLN
25.0000 ug | INTRAMUSCULAR | Status: DC | PRN
  Administered 2015-10-31 (×5): 25 ug via INTRAVENOUS

## 2015-10-31 MED ORDER — OXYCODONE-ACETAMINOPHEN 5-325 MG PO TABS
ORAL_TABLET | ORAL | Status: AC
  Filled 2015-10-31: qty 1

## 2015-10-31 SURGICAL SUPPLY — 47 items
BANDAGE ELASTIC 4 LF NS (GAUZE/BANDAGES/DRESSINGS) ×3 IMPLANT
BIT DRILL 2 FAST STEP (BIT) ×3 IMPLANT
BIT DRILL 2.5X4 QC (BIT) ×3 IMPLANT
BNDG ESMARK 4X12 TAN STRL LF (GAUZE/BANDAGES/DRESSINGS) IMPLANT
CANISTER SUCT 1200ML W/VALVE (MISCELLANEOUS) ×3 IMPLANT
CAST PADDING 3X4FT ST 30246 (SOFTGOODS) ×2
CHLORAPREP W/TINT 26ML (MISCELLANEOUS) ×3 IMPLANT
CORD BIP STRL DISP 12FT (MISCELLANEOUS) IMPLANT
CUFF TOURN 18 STER (MISCELLANEOUS) IMPLANT
DRAPE FLUOR MINI C-ARM 54X84 (DRAPES) ×3 IMPLANT
DRSG EMULSION OIL 3X8 NADH (GAUZE/BANDAGES/DRESSINGS) ×3 IMPLANT
DURAPREP 6ML APPLICATOR 50/CS (WOUND CARE) IMPLANT
ELECT CAUTERY BLADE 6.4 (BLADE) ×3 IMPLANT
ELECT REM PT RETURN 9FT ADLT (ELECTROSURGICAL) ×3
ELECTRODE REM PT RTRN 9FT ADLT (ELECTROSURGICAL) ×1 IMPLANT
FORCEPS JEWEL BIP 4-3/4 STR (INSTRUMENTS) IMPLANT
GAUZE SPONGE 4X4 12PLY STRL (GAUZE/BANDAGES/DRESSINGS) ×3 IMPLANT
GLOVE BIOGEL PI IND STRL 9 (GLOVE) ×1 IMPLANT
GLOVE BIOGEL PI INDICATOR 9 (GLOVE) ×2
GLOVE SURG ORTHO 9.0 STRL STRW (GLOVE) ×3 IMPLANT
GOWN SRG 2XL LVL 4 RGLN SLV (GOWNS) ×1 IMPLANT
GOWN STRL NON-REIN 2XL LVL4 (GOWNS) ×2
GOWN STRL REUS W/ TWL LRG LVL3 (GOWN DISPOSABLE) ×1 IMPLANT
GOWN STRL REUS W/TWL 2XL LVL3 (GOWN DISPOSABLE) ×3 IMPLANT
GOWN STRL REUS W/TWL LRG LVL3 (GOWN DISPOSABLE) ×2
K-WIRE 1.6 (WIRE) ×2
K-WIRE FX5X1.6XNS BN SS (WIRE) ×1
KIT RM TURNOVER STRD PROC AR (KITS) ×3 IMPLANT
KWIRE FX5X1.6XNS BN SS (WIRE) ×1 IMPLANT
NEEDLE HYPO 25X1 1.5 SAFETY (NEEDLE) ×3 IMPLANT
NS IRRIG 500ML POUR BTL (IV SOLUTION) ×3 IMPLANT
PACK EXTREMITY ARMC (MISCELLANEOUS) ×3 IMPLANT
PAD CAST CTTN 3X4 STRL (SOFTGOODS) ×1 IMPLANT
PAD CAST CTTN 4X4 STRL (SOFTGOODS) ×1 IMPLANT
PADDING CAST COTTON 4X4 STRL (SOFTGOODS) ×2
PEG SUBCHONDRAL SMOOTH 2.0X16 (Peg) ×3 IMPLANT
PEG SUBCHONDRAL SMOOTH 2.0X20 (Peg) ×9 IMPLANT
PEG SUBCHONDRAL SMOOTH 2.0X22 (Peg) ×9 IMPLANT
PLATE SHORT 21.6X48.9 NRRW LT (Plate) ×3 IMPLANT
SCREW CORT 3.5X10 LNG (Screw) ×9 IMPLANT
SPLINT CAST 1 STEP 3X12 (MISCELLANEOUS) ×3 IMPLANT
STOCKINETTE STRL 3IN 960336 (SOFTGOODS) IMPLANT
STOCKINETTE STRL 4IN 9604848 (GAUZE/BANDAGES/DRESSINGS) ×3 IMPLANT
SUT ETHILON 4 0 P 3 18 (SUTURE) ×3 IMPLANT
SUT VIC AB 3-0 SH 27 (SUTURE) ×2
SUT VIC AB 3-0 SH 27X BRD (SUTURE) ×1 IMPLANT
SYRINGE 10CC LL (SYRINGE) ×3 IMPLANT

## 2015-10-31 NOTE — ED Provider Notes (Signed)
[facility] Emergency Department Provider Note   ____________________________________________  Time seen: Approximately 8:35 AM  I have reviewed the triage vital signs and the nursing notes.   HISTORY  Chief Complaint Fall and Arm Injury   HPI Jodi Waters is a 81 y.o. female is here with complaint of left wrist pain. Patient states that she lost her balance this morning and fell. Patient initially had some nausea without vomiting due to the pain in her left wrist. She tried catching herself with her left hand. Patient denies any head injury or loss of consciousness. Family member states that patient did not feel well and has not been eating for the last couple of days. Her last meal was last evening which consisted of some cottage cheese and tomatoes. Patient denies any nausea, vomiting, diarrhea. She is unaware of any fever or chills. She denies any upper respiratory symptoms.Patient currently lives with a family member. She considers herself to be doing well prior to her fall. Currently she rates her pain as a 10 over 10.   Past Medical History  Diagnosis Date  . Diabetes mellitus without complication ([place])   . Dysrhythmia     afib  . Depression   . Guillain Barr syndrome ([place])   . Hypothyroidism   . Hyperlipidemia   . Migraine   . Peripheral neuropathy [facility])     Patient Active Problem List   Diagnosis Date Noted  . Distal radius fracture 10/31/2015  . Atrial fibrillation ([place]) 06/05/2015  . Controlled type 2 diabetes mellitus without complication ([place]) [id number]  . Guillain-Barre syndrome ([place]) 06/05/2015  . Pulmonary hypertension ([place]) 06/05/2015  . 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 Aortic valve defect 04/30/2015  . Disorder of mitral valve 04/30/2015    Past Surgical History  Procedure Laterality Date  . Pacemaker insertion    . Cardiac surgery    . Abdominal hysterectomy    . Tonsillectomy    . Cardiac valve replacement     mitral and aortic valve  . Insert / replace / remove pacemaker    . Cataract extraction    . Cardiac catheterization N/A 06/05/2015    Procedure: Right Heart Cath;  Surgeon: Teodoro Spray, MD;  Location: [place] [facility];  Service: Cardiovascular;  Laterality: N/A;    Current Outpatient Rx  Name  Route  Sig  Dispense  Refill  . albuterol (PROVENTIL HFA;VENTOLIN HFA) 108 (90 BASE) MCG/ACT inhaler   Inhalation   Inhale 2 puffs into the lungs every 4 (four) hours as needed for wheezing or shortness of breath. Patient not taking: Reported on 06/05/2015   1 Inhaler   2   . digoxin (LANOXIN) 0.125 MG tablet   Oral   Take 1 mg by mouth every morning.         . furosemide (LASIX) 40 MG tablet   Oral   Take 40 mg by mouth 2 (two) times daily.         Marland Kitchen gabapentin (NEURONTIN) 100 MG capsule   Oral   Take 200 mg by mouth every evening.         Marland Kitchen levothyroxine (SYNTHROID, LEVOTHROID) 50 MCG tablet   Oral   Take 50 mcg by mouth daily before breakfast.         . losartan (COZAAR) 25 MG tablet   Oral   Take 25 mg by mouth daily.         Marland Kitchen PARoxetine (PAXIL) 20 MG tablet   Oral  Take 20 mg by mouth every evening.         . simvastatin (ZOCOR) 20 MG tablet   Oral   Take 20 mg by mouth every evening.         . verapamil (VERELAN PM) 180 MG 24 hr capsule   Oral   Take 180 mg by mouth daily.         Marland Kitchen warfarin (COUMADIN) 4 MG tablet   Oral   Take 5 mg by mouth daily.           Allergies Penicillins  Family History  Problem Relation Age of Onset  . Bone cancer Maternal Uncle   . Bone cancer Maternal Grandmother     Social History Social History  Substance Use Topics  . Smoking status: Never Smoker   . Smokeless tobacco: None  . Alcohol Use: No    Review of Systems Constitutional: No fever/chills Eyes: No visual changes. ENT: No trauma Cardiovascular: Denies chest pain. Respiratory: Denies shortness of breath. Gastrointestinal: No abdominal  pain.  Resolved nausea, no vomiting.  No diarrhea.  No constipation. Genitourinary: Negative for dysuria. Musculoskeletal: Negative for back pain. Negative for neck pain. Skin: Negative for rash. Neurological: Negative for headaches, focal weakness or numbness.  10-point ROS otherwise negative.  ____________________________________________   PHYSICAL EXAM:  VITAL SIGNS: ED Triage Vitals  Enc Vitals Group     BP --      Pulse --      Resp --      Temp --      Temp src --      SpO2 --      Weight --      Height --      Head Cir --      Peak Flow --      Pain Score 10/31/15 0828 10     Pain Loc --      Pain Edu? --      Excl. in [place]? --     Constitutional: Alert and oriented. Well appearing and in no acute distress. Eyes: Conjunctivae are normal. PERRL. EOMI. Head: Atraumatic. Nose: No congestion/rhinnorhea. Neck: No stridor.  No cervical tenderness on palpation posteriorly. Cardiovascular: Normal rate, regular rhythm. Grossly normal heart sounds.  Good peripheral circulation. Respiratory: Normal respiratory effort.  No retractions. Lungs CTAB. Gastrointestinal: Soft and nontender. No distention.  Musculoskeletal: On examination of the left wrist there is obvious deformity present. Range of motion of the right wrist is restricted secondary to pain. Motor sensory function intact in the digits distal to the injury. Capillary refill less than 3 seconds. No abrasions or open wound is noted. There is no deformity or pain on palpation of the left shoulder. There are no other injuries noted on the lower extremities. Neurologic:  Normal speech and language. No gross focal neurologic deficits are appreciated. Gait was not tested secondary to patient's injury Skin:  Skin is warm, dry and intact. No rash noted. No abrasions, erythema or ecchymosis is noted. Psychiatric: Mood and affect are normal. Speech and behavior are normal.  ____________________________________________    LABS (all labs ordered are listed, but only abnormal results are displayed)  Labs Reviewed  CBC WITH DIFFERENTIAL/PLATELET - Abnormal; Notable for the following:    RDW 16.4 (*)    Platelets 147 (*)    Neutro Abs 8.4 (*)    Lymphs Abs 0.6 (*)    Monocytes Absolute 1.0 (*)    All other components within normal limits  COMPREHENSIVE METABOLIC PANEL - Abnormal; Notable for the following:    Glucose, Bld 193 (*)    Calcium 8.8 (*)    Total Bilirubin 2.1 (*)    All other components within normal limits  PROTIME-INR - Abnormal; Notable for the following:    Prothrombin Time 20.7 (*)    All other components within normal limits  URINALYSIS COMPLETEWITH MICROSCOPIC ([facility] ONLY)   ____________________________________________  EKG  Deferred ____________________________________________  RADIOLOGY  Left wrist x-ray per radiologist shows acute grossly displaced and angulated distal left radius and ulna fractures. ____________________________________________   PROCEDURES  Procedure(s) performed: None  Procedures  Critical Care performed: No  ____________________________________________   INITIAL IMPRESSION / ASSESSMENT AND PLAN / ED COURSE  Pertinent labs & imaging results that were available during my care of the patient were reviewed by me and considered in my medical decision making (see chart for details).  I spoke with Dr. Rudene Christians on the phone who is the orthopedist on call today. Patient is to be seen in the emergency room. Patient was given ice bag to apply to her wrist to reduce swelling and she and family member aware that she is to be nothing by mouth until seen.  ----------------------------------------- 12:43 PM on 10/31/2015 ----------------------------------------- Dr. Rudene Christians saw patient in the ER and patient is being admitted for surgery later today.  ____________________________________________   FINAL CLINICAL IMPRESSION(S) / ED DIAGNOSES  Final  diagnoses:  Fracture of radius and ulna, distal, left, closed, initial encounter      NEW MEDICATIONS STARTED DURING THIS VISIT:  New Prescriptions   No medications on file     Note:  This document was prepared using Dragon voice recognition software and may include unintentional dictation errors.    Johnn Hai, PA-C 10/31/15 1244  Harvest Dark, MD 10/31/15 [phone]

## 2015-10-31 NOTE — ED Notes (Addendum)
Patient states that she lost her balance this morning and fell, patient caught herself with her left hand. Patient denies LOC or hitting head.  Patient has not had anything to eat or drink since last pm

## 2015-10-31 NOTE — ED Notes (Signed)
Patient transported to X-ray 

## 2015-10-31 NOTE — ED Notes (Signed)
Lab notified to add PT/INR to blood work.

## 2015-10-31 NOTE — H&P (Signed)
Subjective:   Patient is a 81 y.o. female presents with severe left wrist pain after fall at home. Onset of symptoms was abrupt starting 3 hours ago with gradually worsening course since that time. The pain is located at left wrist. Patient describes the pain as intense  continuous and rated as severe. Pain has been associated with no LOC, no numbness. Patient denies head injury. Symptoms are aggravated by movement. Symptoms improve withnothing. Past history includes Guillain Barre syndrome with clawing of right hand.  Previous studies include xrays, labs.  Patient Active Problem List   Diagnosis Date Noted  . Atrial fibrillation ([place]) 06/05/2015  . Controlled type 2 diabetes mellitus without complication ([place]) [id number]  . Guillain-Barre syndrome ([place]) 06/05/2015  . Pulmonary hypertension ([place]) 06/05/2015  . 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 Aortic valve defect 04/30/2015  . Disorder of mitral valve 04/30/2015   Past Medical History  Diagnosis Date  . Diabetes mellitus without complication ([place])   . Dysrhythmia     afib  . Depression   . Guillain Barr syndrome ([place])   . Hypothyroidism   . Hyperlipidemia   . Migraine   . Peripheral neuropathy [facility])     Past Surgical History  Procedure Laterality Date  . Pacemaker insertion    . Cardiac surgery    . Abdominal hysterectomy    . Tonsillectomy    . Cardiac valve replacement      mitral and aortic valve  . Insert / replace / remove pacemaker    . Cataract extraction    . Cardiac catheterization N/A 06/05/2015    Procedure: Right Heart Cath;  Surgeon: Teodoro Spray, MD;  Location: [place] [facility];  Service: Cardiovascular;  Laterality: N/A;     (Not in a hospital admission) Allergies  Allergen Reactions  . Penicillins Swelling    Social History  Substance Use Topics  . Smoking status: Never Smoker   . Smokeless tobacco: Not on file  . Alcohol Use: No    Family History  Problem Relation Age of Onset  . Bone  cancer Maternal Uncle   . Bone cancer Maternal Grandmother     Review of Systems Pertinent items are noted in HPI.  Objective:   Patient Vitals for the past 8 hrs:  BP Pulse SpO2  10/31/15 0830 139/78 mmHg 86 94 %          BP 139/78 mmHg  Pulse 86  SpO2 94% General appearance: alert, cooperative and appears stated age Lungs: clear to auscultation bilaterally Heart: irregularly irregular rhythm and systolic murmur: holosystolic 4/6,     Extremities: silver fork deformity left wrist, neuro intact, abrasion to dorsum of thumb Skin: Skin color, texture, turgor normal. No rashes or lesions    Assessment:   Active Problems:   * No active hospital problems. * comminuted distal radius fracture  Plan:   ORIF today as outpatient

## 2015-10-31 NOTE — ED Notes (Signed)
Pt fell early this am injuring her left wrist. Deformity noted to left wrist. CMS intact.

## 2015-10-31 NOTE — Progress Notes (Signed)
Fingers warm and dry to left hand and able to move fingers  Pt breahing fine without difficulty but coughing at intervals

## 2015-10-31 NOTE — Op Note (Signed)
10/31/2015  3:12 PM  PATIENT:  Jodi Waters  81 y.o. female  PRE-OPERATIVE DIAGNOSIS:  left wrist fracture comminuted intra-articular  POST-OPERATIVE DIAGNOSIS:  left wrist fracture same  PROCEDURE:  Procedure(s): OPEN REDUCTION INTERNAL FIXATION (ORIF) WRIST FRACTURE (Left)  SURGEON: Laurene Footman, MD  ASSISTANTS: None  ANESTHESIA:   general  EBL:  Total I/O In: 400 [I.V.:400] Out: -   BLOOD ADMINISTERED:none  DRAINS: none   LOCAL MEDICATIONS USED:  NONE  SPECIMEN:  No Specimen  DISPOSITION OF SPECIMEN:  N/A  COUNTS:  YES  TOURNIQUET:   Total Tourniquet Time Documented: Upper Arm (Left) - 24 minutes Total: Upper Arm (Left) - 24 minutes   IMPLANTS: [employer] innovations short narrow DVR plate with multiple smooth pegs and screws  DICTATION: .Dragon Dictation patient brought the operating room and after adequate general anesthesia was obtained, the left arm was prepped and draped in usual sterile fashion. After patient identification and timeout procedures were completed, tourniquet was raised to her 50 murmurs mercury. Fingertrap traction was applied to the index and middle fingers with 10 pounds of traction off the end of the table. Volar approach is made over the FCR and the FCR tendon was retracted radially protect the radial artery and vessels. Next the deep fascia was incised and the pronator elevated off the fracture site proximal and distal fragments. With traction and use of a freer elevator the fracture could be reduced near anatomically. A volar plate was then placed the appropriate position and a distal first approach was carried out with multiple smooth pegs placed in the distal fragment. Next plate traction was released and the plate was brought volar this restored volar tilt to the distal radial fragments 3 10 mm screws were inserted in the shaft holding the shaft to the plate under fluoroscopic views the fracture was stable and essentially anatomic alignment. The  tourniquet was let down and hemostasis checked electrocautery. Wound was closed with 3-0 Vicryl subcutaneously and 4-0 nylon for the skin. Xeroform 4 x 4's web roll and Ace wrap applied with a volar splint.  PLAN OF CARE: Discharge to home after PACU  PATIENT DISPOSITION:  PACU - hemodynamically stable.

## 2015-10-31 NOTE — Discharge Instructions (Signed)
Keep arm elevated as much as possible. Work fingers is much as tolerated. Pain medicine as needed and directed. Ice to back of wrist today and tomorrow   AMBULATORY SURGERY  DISCHARGE INSTRUCTIONS   1) The drugs that you were given will stay in your system until tomorrow so for the next 24 hours you should not:  A) Drive an automobile B) Make any legal decisions C) Drink any alcoholic beverage   2) You may resume regular meals tomorrow.  Today it is better to start with liquids and gradually work up to solid foods.  You may eat anything you prefer, but it is better to start with liquids, then soup and crackers, and gradually work up to solid foods.   3) Please notify your doctor immediately if you have any unusual bleeding, trouble breathing, redness and pain at the surgery site, drainage, fever, or pain not relieved by medication.    4) Additional Instructions:        Please contact your physician with any problems or Same Day Surgery at [phone], Monday through Friday 6 am to 4 pm, or [place] at [facility] number at [phone].

## 2015-10-31 NOTE — ED Notes (Signed)
Ice applied to left wrist

## 2015-10-31 NOTE — ED Notes (Signed)
Patient transferred to OR via stretcher and with OR tech.

## 2015-10-31 NOTE — Progress Notes (Signed)
One norco given for pain at 5:12   Pt states pain is  Level 5 but wants to go home   Can move fingers to left hand

## 2015-10-31 NOTE — Anesthesia Procedure Notes (Signed)
Procedure Name: LMA Insertion Date/Time: 10/31/2015 2:00 PM Performed by: Allean Found Pre-anesthesia Checklist: Patient identified, Emergency Drugs available, Suction available, Patient being monitored and Timeout performed Patient Re-evaluated:Patient Re-evaluated prior to inductionOxygen Delivery Method: Circle system utilized Preoxygenation: Pre-oxygenation with 100% oxygen Intubation Type: IV induction Ventilation: Mask ventilation without difficulty LMA: LMA inserted Tube size: 4.0 mm Number of attempts: 1 Placement Confirmation: ETT inserted through vocal cords under direct vision,  positive ETCO2 and breath sounds checked- equal and bilateral Tube secured with: Tape Dental Injury: Teeth and Oropharynx as per pre-operative assessment

## 2015-10-31 NOTE — Anesthesia Postprocedure Evaluation (Signed)
Anesthesia Post Note  Patient: Jodi Waters  Procedure(s) Performed: Procedure(s) (LRB): OPEN REDUCTION INTERNAL FIXATION (ORIF) WRIST FRACTURE (Left)  Patient location during evaluation: PACU Anesthesia Type: General Level of consciousness: awake Pain management: satisfactory to patient Vital Signs Assessment: post-procedure vital signs reviewed and stable Respiratory status: spontaneous breathing Cardiovascular status: stable Anesthetic complications: no    Last Vitals:  Filed Vitals:   10/31/15 1553 10/31/15 1608  BP: 154/59 133/60  Pulse: 77 86  Temp:  36.3 C  Resp: 21 23    Last Pain:  Filed Vitals:   10/31/15 1611  PainSc: 3                  VAN STAVEREN,Axl Rodino

## 2015-10-31 NOTE — Transfer of Care (Signed)
Immediate Anesthesia Transfer of Care Note  Patient: Jodi Waters  Procedure(s) Performed: Procedure(s): OPEN REDUCTION INTERNAL FIXATION (ORIF) WRIST FRACTURE (Left)  Patient Location: PACU  Anesthesia Type:General  Level of Consciousness: sedated  Airway & Oxygen Therapy: Patient Spontanous Breathing and Patient connected to face mask oxygen  Post-op Assessment: Report given to RN and Post -op Vital signs reviewed and stable  Post vital signs: Reviewed and stable  Last Vitals:  Filed Vitals:   10/31/15 1335 10/31/15 1508  BP: 146/88 132/72  Pulse: 71 93  Temp: 36.7 C 36.1 C  Resp: 16 8    Last Pain:  Filed Vitals:   10/31/15 1510  PainSc: Asleep         Complications: No apparent anesthesia complications

## 2015-10-31 NOTE — Anesthesia Preprocedure Evaluation (Addendum)
Anesthesia Evaluation  Patient identified by MRN, date of birth, ID band Patient awake    Reviewed: Allergy & Precautions, NPO status , Patient's Chart, lab work & pertinent test results, reviewed documented beta blocker date and time   Airway Mallampati: II  TM Distance: >3 FB     Dental  (+) Chipped, Missing   Pulmonary           Cardiovascular + CAD  + dysrhythmias Atrial Fibrillation + pacemaker      Neuro/Psych  Headaches, PSYCHIATRIC DISORDERS Depression  Neuromuscular disease    GI/Hepatic   Endo/Other  diabetes, Type 2Hypothyroidism   Renal/GU      Musculoskeletal   Abdominal   Peds  Hematology   Anesthesia Other Findings Guillain Barre. INR 1.7, surgeon aware. Mild pulmonary hypertension. Some weakness and tingling loweer extremities. Mild pulmonary hypertension. Had mitral valve replaced and Aortic valve replaced.  Reproductive/Obstetrics                           Anesthesia Physical Anesthesia Plan  ASA: III  Anesthesia Plan: General   Post-op Pain Management:    Induction: Intravenous  Airway Management Planned:   Additional Equipment:   Intra-op Plan:   Post-operative Plan:   Informed Consent: I have reviewed the patients History and Physical, chart, labs and discussed the procedure including the risks, benefits and alternatives for the proposed anesthesia with the patient or authorized representative who has indicated his/her understanding and acceptance.     Plan Discussed with: CRNA  Anesthesia Plan Comments:         Anesthesia Quick Evaluation

## 2015-11-03 ENCOUNTER — Encounter: Payer: Self-pay | Admitting: Orthopedic Surgery

## 2016-07-05 ENCOUNTER — Encounter: Payer: Self-pay | Admitting: Emergency Medicine

## 2016-07-05 ENCOUNTER — Emergency Department
Admission: EM | Admit: 2016-07-05 | Discharge: 2016-07-05 | Disposition: A | Discharge status: Home / Self Care | Payer: Medicare Other | Attending: Emergency Medicine | Admitting: Emergency Medicine

## 2016-07-05 DIAGNOSIS — R040 Epistaxis: Secondary | ICD-10-CM | POA: Diagnosis not present

## 2016-07-05 DIAGNOSIS — Z79899 Other long term (current) drug therapy: Secondary | ICD-10-CM | POA: Insufficient documentation

## 2016-07-05 DIAGNOSIS — E039 Hypothyroidism, unspecified: Secondary | ICD-10-CM | POA: Diagnosis not present

## 2016-07-05 DIAGNOSIS — Z7901 Long term (current) use of anticoagulants: Secondary | ICD-10-CM | POA: Diagnosis not present

## 2016-07-05 DIAGNOSIS — E119 Type 2 diabetes mellitus without complications: Secondary | ICD-10-CM | POA: Insufficient documentation

## 2016-07-05 DIAGNOSIS — Z950 Presence of cardiac pacemaker: Secondary | ICD-10-CM | POA: Insufficient documentation

## 2016-07-05 DIAGNOSIS — Z952 Presence of prosthetic heart valve: Secondary | ICD-10-CM | POA: Diagnosis not present

## 2016-07-05 LAB — BASIC METABOLIC PANEL
Anion gap: 6 (ref 5–15)
BUN: 19 mg/dL (ref 6–20)
CO2: 27 mmol/L (ref 22–32)
CREATININE: 1.06 mg/dL — AB (ref 0.44–1.00)
Calcium: 8.5 mg/dL — ABNORMAL LOW (ref 8.9–10.3)
Chloride: 103 mmol/L (ref 101–111)
GFR, EST AFRICAN AMERICAN: 56 mL/min — AB (ref >60)
GFR, EST NON AFRICAN AMERICAN: 49 mL/min — AB (ref >60)
Glucose, Bld: 181 mg/dL — ABNORMAL HIGH (ref 65–99)
POTASSIUM: 3.9 mmol/L (ref 3.5–5.1)
SODIUM: 136 mmol/L (ref 135–145)

## 2016-07-05 LAB — CBC WITH DIFFERENTIAL/PLATELET
BASOS ABS: 0 10*3/uL (ref 0–0.1)
Basophils Relative: 1 %
Eosinophils Absolute: 0.1 10*3/uL (ref 0–0.7)
Eosinophils Relative: 2 %
HEMATOCRIT: 33.4 % — AB (ref 35.0–47.0)
Hemoglobin: 11.4 g/dL — ABNORMAL LOW (ref 12.0–16.0)
Lymphocytes Relative: 17 %
Lymphs Abs: 0.9 10*3/uL — ABNORMAL LOW (ref 1.0–3.6)
MCH: 30.9 pg (ref 26.0–34.0)
MCHC: 34.2 g/dL (ref 32.0–36.0)
MCV: 90.4 fL (ref 80.0–100.0)
MONO ABS: 0.6 10*3/uL (ref 0.2–0.9)
Monocytes Relative: 11 %
NEUTROS ABS: 3.7 10*3/uL (ref 1.4–6.5)
Neutrophils Relative %: 69 %
Platelets: 127 10*3/uL — ABNORMAL LOW (ref 150–440)
RBC: 3.7 MIL/uL — AB (ref 3.80–5.20)
RDW: 15.8 % — AB (ref 11.5–14.5)
WBC: 5.4 10*3/uL (ref 3.6–11.0)

## 2016-07-05 LAB — PROTIME-INR
INR: 2.12
Prothrombin Time: 24.1 seconds — ABNORMAL HIGH (ref 11.4–15.2)

## 2016-07-05 MED ORDER — LIDOCAINE HCL 2 % EX GEL
CUTANEOUS | Status: AC
  Filled 2016-07-05: qty 10

## 2016-07-05 MED ORDER — CLINDAMYCIN HCL 150 MG PO CAPS
300.0000 mg | ORAL_CAPSULE | Freq: Once | ORAL | Status: AC
  Administered 2016-07-05: 300 mg via ORAL
  Filled 2016-07-05: qty 2

## 2016-07-05 MED ORDER — LIDOCAINE HCL 2 % EX GEL
1.0000 "application " | Freq: Once | CUTANEOUS | Status: AC
  Administered 2016-07-05: 1 via TOPICAL

## 2016-07-05 MED ORDER — CLINDAMYCIN HCL 300 MG PO CAPS: 300.0000 mg | ORAL_CAPSULE | Freq: Two times a day (BID) | ORAL | 0 refills | Status: DC

## 2016-07-05 MED ORDER — TRANEXAMIC ACID 1000 MG/10ML IV SOLN
500.0000 mg | Freq: Once | INTRAVENOUS | Status: AC
  Administered 2016-07-05: 500 mg via TOPICAL
  Filled 2016-07-05: qty 10

## 2016-07-05 NOTE — ED Provider Notes (Signed)
[facility] Emergency Department Provider Note   ____________________________________________   First MD Initiated Contact with Patient 07/05/16 0012     (approximate)  I have reviewed the triage vital signs and the nursing notes.   HISTORY  Chief Complaint Epistaxis    HPI Quinta Eimer is a 82 y.o. female brought to the ED from home via EMS with a chief complaint of nosebleed. Patient is on warfarin for atrial fibrillation as well as heart valve replacement who reports trouble with her sinuses this week. Reports intermittent right-sided nosebleed all day. Denies similar symptoms previously. Denies fever, chills, chest pain, shortness of breath, abdominal pain, nausea, vomiting, lightheadedness. Denies recent travel or trauma. Nothing makes her symptoms better or worse.   Past Medical History:  Diagnosis Date  . Depression   . Diabetes mellitus without complication ([place])   . Dysrhythmia    afib  . Guillain Barr syndrome ([place])   . Hyperlipidemia   . Hypothyroidism   . Migraine   . Peripheral neuropathy [facility])     Patient Active Problem List   Diagnosis Date Noted  . Distal radius fracture 10/31/2015  . Atrial fibrillation ([place]) 06/05/2015  . Controlled type 2 diabetes mellitus without complication ([place]) 13/24/4010  . Guillain-Barre syndrome ([place]) 06/05/2015  . Pulmonary hypertension 06/05/2015  . 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 Aortic valve defect 04/30/2015  . Disorder of mitral valve 04/30/2015    Past Surgical History:  Procedure Laterality Date  . ABDOMINAL HYSTERECTOMY    . CARDIAC CATHETERIZATION N/A 06/05/2015   Procedure: Right Heart Cath;  Surgeon: Teodoro Spray, MD;  Location: [place] [facility];  Service: Cardiovascular;  Laterality: N/A;  . CARDIAC SURGERY    . CARDIAC VALVE REPLACEMENT     mitral and aortic valve  . CATARACT EXTRACTION    . INSERT / REPLACE / REMOVE PACEMAKER    . ORIF WRIST FRACTURE Left 10/31/2015     Procedure: OPEN REDUCTION INTERNAL FIXATION (ORIF) WRIST FRACTURE;  Surgeon: Hessie Knows, MD;  Location: [facility] ORS;  Service: Orthopedics;  Laterality: Left;  . PACEMAKER INSERTION    . TONSILLECTOMY      Prior to Admission medications   Medication Sig Start Date End Date Taking? Authorizing Provider  clindamycin (CLEOCIN) 300 MG capsule Take 1 capsule (300 mg total) by mouth 2 (two) times daily. 07/05/16   Paulette Blanch, MD  digoxin (LANOXIN) 0.125 MG tablet Take 1 mg by mouth every morning.    Historical Provider, MD  doxycycline (VIBRA-TABS) 100 MG tablet Take 1 tablet by mouth 2 (two) times daily. 10/30/15   Historical Provider, MD  furosemide (LASIX) 40 MG tablet Take 40 mg by mouth 2 (two) times daily.    Historical Provider, MD  gabapentin (NEURONTIN) 100 MG capsule Take 200 mg by mouth every evening.    Historical Provider, MD  HYDROcodone-acetaminophen (NORCO) 5-325 MG tablet Take 1 tablet by mouth every 6 (six) hours as needed for moderate pain. 10/31/15   Hessie Knows, MD  levothyroxine (SYNTHROID, LEVOTHROID) 50 MCG tablet Take 50 mcg by mouth daily before breakfast.    Historical Provider, MD  losartan (COZAAR) 25 MG tablet Take 25 mg by mouth daily.    Historical Provider, MD  PARoxetine (PAXIL) 20 MG tablet Take 20 mg by mouth every evening.    Historical Provider, MD  simvastatin (ZOCOR) 20 MG tablet Take 20 mg by mouth every evening.    Historical Provider, MD  verapamil (VERELAN PM) 180  MG 24 hr capsule Take 180 mg by mouth daily.    Historical Provider, MD  VIRTUSSIN A/C 100-10 MG/5ML syrup Take 10 mLs by mouth every 6 (six) hours as needed. 10/29/15   Historical Provider, MD  warfarin (COUMADIN) 4 MG tablet Take 4 mg by mouth daily.     Historical Provider, MD    Allergies Penicillins  Family History  Problem Relation Age of Onset  . Bone cancer Maternal Uncle   . Bone cancer Maternal Grandmother     Social History Social History  Substance Use Topics  . Smoking  status: Never Smoker  . Smokeless tobacco: Never Used  . Alcohol use No    Review of Systems  Constitutional: No fever/chills. Eyes: No visual changes. ENT: Positive for nose bleed. Cardiovascular: Denies chest pain. Respiratory: Denies shortness of breath. Gastrointestinal: No abdominal pain.  No nausea, no vomiting.  No diarrhea.  No constipation. Genitourinary: Negative for dysuria. Musculoskeletal: Negative for back pain. Skin: Negative for rash. Neurological: Negative for headaches, focal weakness or numbness.  10-point ROS otherwise negative.  ____________________________________________   PHYSICAL EXAM:  VITAL SIGNS: ED Triage Vitals  Enc Vitals Group     BP      Pulse      Resp      Temp      Temp src      SpO2      Weight      Height      Head Circumference      Peak Flow      Pain Score      Pain Loc      Pain Edu?      Excl. in [place]?     Constitutional: Alert and oriented. Well appearing and in no acute distress. Eyes: Conjunctivae are normal. PERRL. EOMI. Head: Atraumatic. Nose: No active bleeding; area of irritation right nares. Mouth/Throat: Mucous membranes are moist.  Oropharynx non-erythematous. Neck: No stridor.   Cardiovascular: Normal rate, irregular rhythm. Grossly normal heart sounds.  Good peripheral circulation. Respiratory: Normal respiratory effort.  No retractions. Lungs CTAB. Gastrointestinal: Soft and nontender. No distention. No abdominal bruits. No CVA tenderness. Musculoskeletal: No lower extremity tenderness nor edema.  No joint effusions. Neurologic:  Normal speech and language. No gross focal neurologic deficits are appreciated. No gait instability. Skin:  Skin is warm, dry and intact. No rash noted. Psychiatric: Mood and affect are normal. Speech and behavior are normal.  ____________________________________________   LABS (all labs ordered are listed, but only abnormal results are displayed)  Labs Reviewed  CBC WITH  DIFFERENTIAL/PLATELET - Abnormal; Notable for the following:       Result Value   RBC 3.70 (*)    Hemoglobin 11.4 (*)    HCT 33.4 (*)    RDW 15.8 (*)    Platelets 127 (*)    Lymphs Abs 0.9 (*)    All other components within normal limits  PROTIME-INR - Abnormal; Notable for the following:    Prothrombin Time 24.1 (*)    All other components within normal limits  BASIC METABOLIC PANEL - Abnormal; Notable for the following:    Glucose, Bld 181 (*)    Creatinine, Ser 1.06 (*)    Calcium 8.5 (*)    GFR calc non Af Amer 49 (*)    GFR calc Af Amer 56 (*)    All other components within normal limits   ____________________________________________  EKG  None ____________________________________________  RADIOLOGY  None ____________________________________________   PROCEDURES  Procedure(s) performed:    Merocel nasal packing   Procedures  Critical Care performed: No  ____________________________________________   INITIAL IMPRESSION / ASSESSMENT AND PLAN / ED COURSE  Pertinent labs & imaging results that were available during my care of the patient were reviewed by me and considered in my medical decision making (see chart for details).  82 year old female on warfarin who presents with intermittent right-sided nosebleed, currently nonbleeding. Will check CBC, INR; start with packing right nostril with cotton ball soaked with Afrin and lidocaine.  Clinical Course as of Jul 06 534  Mon Jul 05, 2016  0212 Cotton ball with lidocaine and Afrin removed. No active bleeding. Cotton ball soaked with TXA placed.  [JS]  0231 Hemorrhaging from right nares. Merocel packing placed. Soaking through Merocel packing and now bleeding from left nares. Discussed with Dr. Kathyrn Sheriff who will evaluate patient in the emergency department.  [JS]  0330 Dr. Kathyrn Sheriff evaluated patient at bedside; bleeding stopped after Merocel placement. She was watched in the ED; no further bleeding after several  reassessments by ENT. Recommends clindamycin 300 mg twice daily 10 days, Tylenol as needed for discomfort, and she will be seen in clinic on Friday. Strict return precautions given. Patient and family verbalize understanding and agree with plan of care.  [JS]    Clinical Course User Index [JS] Paulette Blanch, MD     ____________________________________________   FINAL CLINICAL IMPRESSION(S) / ED DIAGNOSES  Final diagnoses:  Right-sided epistaxis      NEW MEDICATIONS STARTED DURING THIS VISIT:  Discharge Medication List as of 07/05/2016  3:34 AM    START taking these medications   Details  clindamycin (CLEOCIN) 300 MG capsule Take 1 capsule (300 mg total) by mouth 2 (two) times daily., Starting Mon 07/05/2016, Print         Note:  This document was prepared using Dragon voice recognition software and may include unintentional dictation errors.    Paulette Blanch, MD 07/05/16 [phone]

## 2016-07-05 NOTE — Discharge Instructions (Signed)
1. Take antibiotic as prescribed (clindamycin 300 mg twice daily 10 days). 2. Continue warfarin daily. 3. Return to the ER for worsening symptoms, heavy bleeding, persistent vomiting, difficulty breathing or other concerns.

## 2016-07-05 NOTE — ED Triage Notes (Signed)
Pt arrives via [facility] with c/o nosebleed x3 today. At this time, pt's nose is not bleeding but pt des have bloody towel which shows approximately 1/4 cup of blood. Pt reports coumadin medication. Pt is in NAD at this time.

## 2016-07-05 NOTE — Consult Note (Signed)
Jodi Waters, Jodi Waters [id number] 09/08/1934 Paulette Blanch, MD  Reason for Consult: Uncontrolled epistaxis  HPI: The patient is a 82 year old white female who has been on Coumadin because of heart valve replacement. She's never had epistaxis challenges before but started with some nosebleeds in the last 24 hours. His blood on and off 3 different times but has stopped spontaneously. It started again this evening about 11:00 and she was brought into the emergency room because it would not stop. Her INR at this time was 2.12. Vasoconstriction did not work so a Merocel sponge was placed in the right nostril. There was active bleeding immediately after putting it in and the sponge filled up with blood but by the time I got in to see her the bleeding had stopped. She has the sponge and a right nostril with blood staining but no running from front of her nose or any bleeding down the back of her throat.  Allergies:  Allergies  Allergen Reactions  . Penicillins     Has patient had a PCN reaction causing immediate rash, facial/tongue/throat swelling, SOB or lightheadedness with hypotension: Yes Has patient had a PCN reaction causing severe rash involving mucus membranes or skin necrosis: No Has patient had a PCN reaction that required hospitalization Yes Has patient had a PCN reaction occurring within the last 10 years: Yes If all of the above answers are "NO", then may proceed with Cephalosporin use.     ROS: Review of systems normal other than 12 systems except per HPI.  PMH:  Past Medical History:  Diagnosis Date  . Depression   . Diabetes mellitus without complication ([place])   . Dysrhythmia    afib  . Guillain Barr syndrome ([place])   . Hyperlipidemia   . Hypothyroidism   . Migraine   . Peripheral neuropathy ([facility])     FH:  Family History  Problem Relation Age of Onset  . Bone cancer Maternal Uncle   . Bone cancer Maternal Grandmother     SH:  Social History   Social History  . Marital  status: Widowed    Spouse name: N/A  . Number of children: N/A  . Years of education: N/A   Occupational History  . Not on file.   Social History Main Topics  . Smoking status: Never Smoker  . Smokeless tobacco: Never Used  . Alcohol use No  . Drug use: No  . Sexual activity: Not on file   Other Topics Concern  . Not on file   Social History Narrative  . No narrative on file    PSH:  Past Surgical History:  Procedure Laterality Date  . ABDOMINAL HYSTERECTOMY    . CARDIAC CATHETERIZATION N/A 06/05/2015   Procedure: Right Heart Cath;  Surgeon: Teodoro Spray, MD;  Location: [place] [facility];  Service: Cardiovascular;  Laterality: N/A;  . CARDIAC SURGERY    . CARDIAC VALVE REPLACEMENT     mitral and aortic valve  . CATARACT EXTRACTION    . INSERT / REPLACE / REMOVE PACEMAKER    . ORIF WRIST FRACTURE Left 10/31/2015   Procedure: OPEN REDUCTION INTERNAL FIXATION (ORIF) WRIST FRACTURE;  Surgeon: Hessie Knows, MD;  Location: [facility] ORS;  Service: Orthopedics;  Laterality: Left;  . PACEMAKER INSERTION    . TONSILLECTOMY      Physical  Exam: Well-developed well-nourished white female in no acute distress. CN 2-12 grossly intact and symmetric.  Oral cavity, lips, gums, ororpharynx normal with no masses or lesions. One string  of old blood that is in her nasopharynx but no fresh bleeding at all. Skin warm and dry. Nasal cavity sponge in right nasal cavity that soaked with blood but no blood running from it. External nose and ears without masses or lesions. Neck supple with no masses or lesions.    A/P: Epistaxis that now seems to be stabilized with a Merocel sponge in the right nostril. We'll plan to leave this in for a total of 5 days. See her in the office on Friday for removal of the packing. We'll plan to place her on clindamycin to help prevent sinus infection while the packing is in place. We'll use this and still cephalosporins because of her anaphylaxis to penicillin. She will  rest at home with her feet up for at least the next 24 hours. They'll notify me if she has any further bleeding and we can see her sooner. A note avoid any aspirin products.   Gennie Eisinger H 07/05/2016 3:18 AM

## 2016-09-28 ENCOUNTER — Other Ambulatory Visit: Payer: Self-pay | Admitting: Specialist

## 2016-09-28 DIAGNOSIS — R0602 Shortness of breath: Secondary | ICD-10-CM

## 2016-09-28 DIAGNOSIS — J849 Interstitial pulmonary disease, unspecified: Secondary | ICD-10-CM

## 2016-10-12 ENCOUNTER — Ambulatory Visit: Payer: No Typology Code available for payment source

## 2016-11-29 ENCOUNTER — Other Ambulatory Visit: Payer: Self-pay | Admitting: Specialist

## 2016-11-29 ENCOUNTER — Ambulatory Visit
Admission: RE | Admit: 2016-11-29 | Discharge: 2016-11-29 | Disposition: A | Discharge status: Home / Self Care | Payer: Medicare Other | Source: Ambulatory Visit | Attending: Specialist | Admitting: Specialist

## 2016-11-29 DIAGNOSIS — R0602 Shortness of breath: Secondary | ICD-10-CM | POA: Insufficient documentation

## 2016-11-29 DIAGNOSIS — K7460 Unspecified cirrhosis of liver: Secondary | ICD-10-CM | POA: Insufficient documentation

## 2016-11-29 DIAGNOSIS — R590 Localized enlarged lymph nodes: Secondary | ICD-10-CM | POA: Insufficient documentation

## 2016-11-29 DIAGNOSIS — R918 Other nonspecific abnormal finding of lung field: Secondary | ICD-10-CM | POA: Diagnosis not present

## 2016-11-29 DIAGNOSIS — J849 Interstitial pulmonary disease, unspecified: Secondary | ICD-10-CM

## 2016-11-29 DIAGNOSIS — I517 Cardiomegaly: Secondary | ICD-10-CM | POA: Insufficient documentation

## 2016-11-29 DIAGNOSIS — I2510 Atherosclerotic heart disease of native coronary artery without angina pectoris: Secondary | ICD-10-CM | POA: Insufficient documentation

## 2016-11-29 DIAGNOSIS — J90 Pleural effusion, not elsewhere classified: Secondary | ICD-10-CM | POA: Diagnosis not present

## 2016-11-29 DIAGNOSIS — R188 Other ascites: Secondary | ICD-10-CM | POA: Diagnosis not present

## 2016-11-29 DIAGNOSIS — N200 Calculus of kidney: Secondary | ICD-10-CM | POA: Diagnosis not present

## 2016-11-29 DIAGNOSIS — I77810 Thoracic aortic ectasia: Secondary | ICD-10-CM | POA: Insufficient documentation

## 2016-11-29 DIAGNOSIS — M4856XA Collapsed vertebra, not elsewhere classified, lumbar region, initial encounter for fracture: Secondary | ICD-10-CM | POA: Diagnosis not present

## 2016-11-29 DIAGNOSIS — I281 Aneurysm of pulmonary artery: Secondary | ICD-10-CM | POA: Diagnosis not present

## 2016-11-29 DIAGNOSIS — I700 Atherosclerosis of aorta: Secondary | ICD-10-CM | POA: Diagnosis not present

## 2016-11-29 DIAGNOSIS — N1330 Unspecified hydronephrosis: Secondary | ICD-10-CM | POA: Insufficient documentation

## 2016-11-29 DIAGNOSIS — N132 Hydronephrosis with renal and ureteral calculous obstruction: Secondary | ICD-10-CM

## 2016-12-01 ENCOUNTER — Ambulatory Visit
Admission: RE | Admit: 2016-12-01 | Discharge: 2016-12-01 | Disposition: A | Discharge status: Home / Self Care | Payer: Medicare Other | Source: Ambulatory Visit | Attending: Specialist | Admitting: Specialist

## 2016-12-01 DIAGNOSIS — N132 Hydronephrosis with renal and ureteral calculous obstruction: Secondary | ICD-10-CM | POA: Diagnosis present

## 2016-12-03 ENCOUNTER — Other Ambulatory Visit: Payer: Self-pay | Admitting: Specialist

## 2016-12-03 DIAGNOSIS — N1330 Unspecified hydronephrosis: Secondary | ICD-10-CM

## 2016-12-16 ENCOUNTER — Ambulatory Visit
Admission: RE | Admit: 2016-12-16 | Discharge: 2016-12-16 | Disposition: A | Discharge status: Home / Self Care | Payer: Medicare Other | Source: Ambulatory Visit | Attending: Specialist | Admitting: Specialist

## 2016-12-16 DIAGNOSIS — M4856XA Collapsed vertebra, not elsewhere classified, lumbar region, initial encounter for fracture: Secondary | ICD-10-CM | POA: Diagnosis not present

## 2016-12-16 DIAGNOSIS — K766 Portal hypertension: Secondary | ICD-10-CM | POA: Insufficient documentation

## 2016-12-16 DIAGNOSIS — N132 Hydronephrosis with renal and ureteral calculous obstruction: Secondary | ICD-10-CM | POA: Diagnosis not present

## 2016-12-16 DIAGNOSIS — J90 Pleural effusion, not elsewhere classified: Secondary | ICD-10-CM | POA: Insufficient documentation

## 2016-12-16 DIAGNOSIS — K5730 Diverticulosis of large intestine without perforation or abscess without bleeding: Secondary | ICD-10-CM | POA: Diagnosis not present

## 2016-12-16 DIAGNOSIS — K7460 Unspecified cirrhosis of liver: Secondary | ICD-10-CM | POA: Insufficient documentation

## 2016-12-16 DIAGNOSIS — N1330 Unspecified hydronephrosis: Secondary | ICD-10-CM | POA: Diagnosis not present

## 2016-12-16 DIAGNOSIS — I517 Cardiomegaly: Secondary | ICD-10-CM | POA: Diagnosis not present

## 2016-12-16 DIAGNOSIS — N281 Cyst of kidney, acquired: Secondary | ICD-10-CM | POA: Insufficient documentation

## 2016-12-16 HISTORY — DX: Essential (primary) hypertension: I10

## 2016-12-16 HISTORY — DX: Heart failure, unspecified: I50.9

## 2016-12-16 MED ORDER — IOPAMIDOL (ISOVUE-300) INJECTION 61%
100.0000 mL | Freq: Once | INTRAVENOUS | Status: AC | PRN
  Administered 2016-12-16: 100 mL via INTRAVENOUS

## 2016-12-18 NOTE — Progress Notes (Signed)
  12/21/2016 12:05 PM   Jodi Waters 10/14/1934 [id number]  Referring provider: Hedrick, James, MD [address] [facility] [place],  [address]  Chief Complaint  Patient presents with  . New Patient (Initial Visit)    Kidney stones referred by Dr. Herbon Fleming    HPI: Patient is a 82 year old Caucasian female who is referred by Dr. Herbon Fleming for nephrolithiasis with her daughter, [name].    She underwent a chest CT for an evaluation for interstitial lung disease on 11/29/2016 which noted left hydronephrosis.  Follow up RUS confirmed the left hydronephrosis.  Follow up contrast CT noted severe left hydroureteronephrosis due to 5 mm distal left ureteral calculus.   She also has PMHx significant for DM, cardiomegaly with pleural effusions, cirrhosis, a 4.1 cm ascending thoracic aortic aneurysm and a compression fracture of indeterminate age of L1 vertebral body.  Current Cr is 1.0.    Today, she states that she has not experienced any pain. She has not had any gross hematuria.  She has not had fevers, chills, nausea or vomiting.  UA today is unremarkable.    She does have a prior history of stones - she spontaneously passed one several years ago.   Stone composition is unknown.       Reviewed referral notes - see above.     PMH: Past Medical History:  Diagnosis Date  . CHF (congestive heart failure) (HCC)   . Depression   . Diabetes mellitus without complication (HCC)   . Dysrhythmia    afib  . Guillain Barr syndrome (HCC)   . Hyperlipidemia   . Hypertension   . Hypothyroidism   . Migraine   . Peripheral neuropathy     Surgical History: Past Surgical History:  Procedure Laterality Date  . ABDOMINAL HYSTERECTOMY    . CARDIAC CATHETERIZATION N/A 06/05/2015   Procedure: Right Heart Cath;  Surgeon: Kenneth A Fath, MD;  Location: [facility] INVASIVE CV [facility];  Service: Cardiovascular;  Laterality: N/A;  . CARDIAC SURGERY    . CARDIAC VALVE REPLACEMENT     mitral and aortic valve  . CATARACT EXTRACTION    . INSERT / REPLACE / REMOVE PACEMAKER    . ORIF WRIST FRACTURE Left 10/31/2015   Procedure: OPEN REDUCTION INTERNAL FIXATION (ORIF) WRIST FRACTURE;  Surgeon: Michael Menz, MD;  Location: [facility] ORS;  Service: Orthopedics;  Laterality: Left;  . PACEMAKER INSERTION    . PACEMAKER INSERTION  2012   patient states still has this one 12/21/2016  . TONSILLECTOMY      Home Medications:  Allergies as of 12/21/2016      Reactions   Penicillins    Has patient had a PCN reaction causing immediate rash, facial/tongue/throat swelling, SOB or lightheadedness with hypotension: Yes Has patient had a PCN reaction causing severe rash involving mucus membranes or skin necrosis: No Has patient had a PCN reaction that required hospitalization Yes Has patient had a PCN reaction occurring within the last 10 years: Yes If all of the above answers are "NO", then may proceed with Cephalosporin use.      Medication List       Accurate as of 12/21/16 12:05 PM. Always use your most recent med list.          clindamycin 300 MG capsule Commonly known as:  CLEOCIN Take 1 capsule (300 mg total) by mouth 2 (two) times daily.   digoxin 0.125 MG tablet Commonly known as:  LANOXIN Take 1 mg by   mouth every morning.   doxycycline 100 MG tablet Commonly known as:  VIBRA-TABS Take 1 tablet by mouth 2 (two) times daily.   fluticasone 50 MCG/ACT nasal spray Commonly known as:  FLONASE Place into the nose.   furosemide 40 MG tablet Commonly known as:  LASIX Take 40 mg by mouth 2 (two) times daily.   gabapentin 100 MG capsule Commonly known as:  NEURONTIN Take 200 mg by mouth every evening.   HYDROcodone-acetaminophen 5-325 MG tablet Commonly known as:  NORCO Take 1 tablet by mouth every 6 (six) hours as needed for moderate pain.   levothyroxine 50 MCG tablet Commonly known as:  SYNTHROID, LEVOTHROID Take 50 mcg by mouth daily before breakfast.   losartan  25 MG tablet Commonly known as:  COZAAR Take 25 mg by mouth daily.   PARoxetine 20 MG tablet Commonly known as:  PAXIL Take 20 mg by mouth every evening.   simvastatin 20 MG tablet Commonly known as:  ZOCOR Take 20 mg by mouth every evening.   verapamil 180 MG 24 hr capsule Commonly known as:  VERELAN PM Take 180 mg by mouth daily.   VIRTUSSIN A/C 100-10 MG/5ML syrup Generic drug:  guaiFENesin-codeine Take 10 mLs by mouth every 6 (six) hours as needed.   warfarin 4 MG tablet Commonly known as:  COUMADIN Take 4 mg by mouth daily.   warfarin 1 MG tablet Commonly known as:  COUMADIN Take 0.5 mg by mouth.            Discharge Care Instructions        Start     Ordered   12/21/16 0000  Urinalysis, Complete     12/21/16 1133   12/21/16 0000  CULTURE, URINE COMPREHENSIVE     12/21/16 1133   12/21/16 0000  Basic metabolic panel     12/21/16 1133   12/21/16 0000  CBC with Differential/Platelet     12/21/16 1133      Allergies:  Allergies  Allergen Reactions  . Penicillins     Has patient had a PCN reaction causing immediate rash, facial/tongue/throat swelling, SOB or lightheadedness with hypotension: Yes Has patient had a PCN reaction causing severe rash involving mucus membranes or skin necrosis: No Has patient had a PCN reaction that required hospitalization Yes Has patient had a PCN reaction occurring within the last 10 years: Yes If all of the above answers are "NO", then may proceed with Cephalosporin use.     Family History: Family History  Problem Relation Age of Onset  . Bone cancer Maternal Uncle   . Bone cancer Maternal Grandmother   . Kidney cancer Neg Hx   . Bladder Cancer Neg Hx     Social History:  reports that she has never smoked. She has never used smokeless tobacco. She reports that she does not drink alcohol or use drugs.  ROS: UROLOGY Frequent Urination?: Yes Hard to postpone urination?: Yes Burning/pain with urination?: No Get  up at night to urinate?: Yes Leakage of urine?: Yes Urine stream starts and stops?: No Trouble starting stream?: No Do you have to strain to urinate?: No Blood in urine?: No Urinary tract infection?: No Sexually transmitted disease?: No Injury to kidneys or bladder?: No Painful intercourse?: No Weak stream?: No Currently pregnant?: No Vaginal bleeding?: No Last menstrual period?: n  Gastrointestinal Nausea?: No Vomiting?: No Indigestion/heartburn?: No Diarrhea?: No Constipation?: No  Constitutional Fever: No Night sweats?: No Weight loss?: No Fatigue?: No  Skin Skin rash/lesions?: No   Itching?: No  Eyes Blurred vision?: No Double vision?: No  Ears/Nose/Throat Sore throat?: No Sinus problems?: Yes  Hematologic/Lymphatic Swollen glands?: No Easy bruising?: Yes  Cardiovascular Leg swelling?: Yes Chest pain?: No  Respiratory Cough?: Yes Shortness of breath?: Yes  Endocrine Excessive thirst?: No  Musculoskeletal Back pain?: No Joint pain?: No  Neurological Headaches?: Yes Dizziness?: No  Psychologic Depression?: Yes Anxiety?: No  Physical Exam: BP (!) 159/68   Pulse 97   Ht 5' 4" (1.626 m)   Wt 154 lb 1.6 oz (69.9 kg)   BMI 26.45 kg/m   Constitutional: Well nourished. Alert and oriented, No acute distress. HEENT: [place] AT, moist mucus membranes. Trachea midline, no masses. Cardiovascular: No clubbing, cyanosis, or edema. Respiratory: Normal respiratory effort, no increased work of breathing. GI: Abdomen is soft, non tender, non distended, no abdominal masses. Liver and spleen not palpable.  No hernias appreciated.  Stool sample for occult testing is not indicated.   GU: No CVA tenderness.  No bladder fullness or masses.   Skin: No rashes, bruises or suspicious lesions. Lymph: No cervical or inguinal adenopathy. Neurologic: Grossly intact, no focal deficits, moving all 4 extremities. Psychiatric: Normal mood and affect.  Laboratory  Data: Lab Results  Component Value Date   WBC 5.4 07/05/2016   HGB 11.4 (L) 07/05/2016   HCT 33.4 (L) 07/05/2016   MCV 90.4 07/05/2016   PLT 127 (L) 07/05/2016    Lab Results  Component Value Date   CREATININE 1.06 (H) 07/05/2016    Urinalysis Unremarkable.  See EPIC.   I have reviewed the labs.   Pertinent Imaging: CLINICAL DATA:  Severe left hydronephrosis on ultrasound. Nephrolithiasis.  EXAM: CT ABDOMEN AND PELVIS WITH CONTRAST  TECHNIQUE: Multidetector CT imaging of the abdomen and pelvis was performed using the standard protocol following bolus administration of intravenous contrast.  CONTRAST:  100mL ISOVUE-300 IOPAMIDOL (ISOVUE-300) INJECTION 61%  COMPARISON:  Ultrasound 12/01/2016 and chest CT on 11/29/2016  FINDINGS: Lower Chest: Severe cardiomegaly which is stable. Tiny right pleural effusion, significantly decreased in size since recent chest CT. Distention of IVC and hepatic veins, consistent with right heart insufficiency.  Hepatobiliary: Hepatic cirrhosis. No liver masses identified. Gallbladder is unremarkable.  Pancreas:  No mass or inflammatory changes.  Spleen: Within normal limits in size and appearance.  Adrenals/Urinary Tract: No masses identified. A few tiny sub-cm right renal cysts are noted. Two tiny less than 5 mm nonobstructing calculi are seen in the right kidney however there is no evidence of right-sided ureteral calculi or hydronephrosis. Severe left hydroureteronephrosis is seen due to a 5 mm calculus in the distal left ureter. Unremarkable unopacified urinary bladder.  Stomach/Bowel: No evidence of obstruction, inflammatory process or abnormal fluid collections. Sigmoid diverticulosis noted, without evidence of diverticulitis.  Vascular/Lymphatic: No pathologically enlarged lymph nodes. No abdominal aortic aneurysm. Aortic atherosclerosis. Upper abdominal and esophageal varices, and recanalization of  paraumbilical veins, consistent with portal venous hypertension.  Reproductive: Prior hysterectomy noted. Adnexal regions are unremarkable in appearance.  Other:  None.  Musculoskeletal: No suspicious bone lesions identified. L1 vertebral compression fracture of indeterminate age, which is stable since recent exam.  IMPRESSION: Severe left hydroureteronephrosis due to 5 mm distal left ureteral calculus.  Small nonobstructing right renal calculi.  Cirrhosis and findings of portal venous hypertension. No evidence of hepatic neoplasm.  Severe cardiomegaly with signs of right heart insufficiency. Significant decrease in size of right pleural effusion since recent chest CT.  Colonic diverticulosis, without radiographic evidence of diverticulitis.    L1 vertebral body compression fracture of indeterminate age, but stable since recent chest CT.   Electronically Signed   By: John  Stahl M.D.   On: 12/16/2016 09:23 I have independently reviewed the films.    Assessment & Plan:    Patient will be scheduled for a left ureteroscopy with laser lithotripsy with ureteral stent placement for her obstructing 5 mm left ureteral stone.    1. Left ureteral stone  - explained to the patient that AUA Guidelines for patients with uncomplicated ureteral stones ?10 mm should be offered observation, and those with distal stones of similar size should be offered MET with ?-blockers - she is not a candidate for MET therapy as after reviewing the CT images it appears that the obstruction caused by the stone has been long-standing  - if after 4 to 6 weeks observation with or without MET is not successful we would pursue URS or ESWL, if the patient/clinician decide to intervene sooner based on a shared decision making approach, the clinicians should offer definitive stone treatment - she is not a candidate for ESWL due to her anticoagulation therapy for artificial heart valves  - schedule left  ureteroscopy with laser lithotripsy and ureteral stent placement  - explained to the patient how the procedure is performed and the risks involved  - informed patient that they will have a stent placed during the procedure and will remain in place after the procedure for a short time.   - stent may be removed in the office with a cystoscope or patient may be instructed to remove the stent themselves by the string  - described "stent pain" as feelings of needing to urinate/overactive bladder and a warm, tingling sensation to intense pain in the affected flank  - residual stones within the kidney or ureter may be present after the procedure and may need to have these addressed at a different encounter  - injury to the ureter is the most common intra-operative risk, it may result in an open procedure to correct the defect  - infection and bleeding are also risks  - explained the risks of general anesthesia, such as: MI, CVA, paralysis, coma and/or death.  - advised to contact our office or seek treatment in the ED if becomes febrile or pain/ vomiting are difficult control in order to arrange for emergent/urgent intervention   2. Left hydronephrosis  - obtain RUS to ensure the hydronephrosis has resolved once she has recovered from URS/LL/ureteral stent placment   3. History of nephrolithiasis  - will offer 24 hour urine once patient has recovered  4. Cardiomegaly/artificial heart valves/pacemarker  - will need cardiac clearance and Lovenox bridge   No Follow-up on file.  These notes generated with voice recognition software. I apologize for typographical errors.  Ilynn Stauffer, PA-C  [facility] [address], [address] [place],  [address] [phone]  

## 2016-12-21 ENCOUNTER — Ambulatory Visit (INDEPENDENT_AMBULATORY_CARE_PROVIDER_SITE_OTHER): Payer: Medicare Other | Admitting: Urology

## 2016-12-21 ENCOUNTER — Encounter: Payer: Self-pay | Admitting: Urology

## 2016-12-21 VITALS — BP 159/68 | HR 97 | Ht 64.0 in | Wt 154.1 lb

## 2016-12-21 DIAGNOSIS — N201 Calculus of ureter: Secondary | ICD-10-CM

## 2016-12-21 DIAGNOSIS — N132 Hydronephrosis with renal and ureteral calculous obstruction: Secondary | ICD-10-CM

## 2016-12-21 DIAGNOSIS — Z87442 Personal history of urinary calculi: Secondary | ICD-10-CM

## 2016-12-21 LAB — URINALYSIS, COMPLETE
Bilirubin, UA: NEGATIVE
KETONES UA: NEGATIVE
Leukocytes, UA: NEGATIVE
NITRITE UA: NEGATIVE
Protein, UA: NEGATIVE
Specific Gravity, UA: 1.015 (ref 1.005–1.030)
UUROB: 1 mg/dL (ref 0.2–1.0)
pH, UA: 6 (ref 5.0–7.5)

## 2016-12-21 LAB — MICROSCOPIC EXAMINATION
RBC, UA: NONE SEEN /hpf (ref 0–?)
WBC, UA: NONE SEEN /hpf (ref 0–?)

## 2016-12-22 ENCOUNTER — Other Ambulatory Visit: Payer: Self-pay | Admitting: Radiology

## 2016-12-22 DIAGNOSIS — N201 Calculus of ureter: Secondary | ICD-10-CM

## 2016-12-22 LAB — CBC WITH DIFFERENTIAL/PLATELET
BASOS ABS: 0 10*3/uL (ref 0.0–0.2)
Basos: 0 %
EOS (ABSOLUTE): 0.1 10*3/uL (ref 0.0–0.4)
Eos: 2 %
Hematocrit: 38.1 % (ref 34.0–46.6)
Hemoglobin: 12.2 g/dL (ref 11.1–15.9)
IMMATURE GRANULOCYTES: 0 %
Immature Grans (Abs): 0 10*3/uL (ref 0.0–0.1)
Lymphocytes Absolute: 0.7 10*3/uL (ref 0.7–3.1)
Lymphs: 16 %
MCH: 28.2 pg (ref 26.6–33.0)
MCHC: 32 g/dL (ref 31.5–35.7)
MCV: 88 fL (ref 79–97)
MONOS ABS: 0.4 10*3/uL (ref 0.1–0.9)
Monocytes: 10 %
NEUTROS ABS: 3.2 10*3/uL (ref 1.4–7.0)
Neutrophils: 72 %
PLATELETS: 127 10*3/uL — AB (ref 150–379)
RBC: 4.32 x10E6/uL (ref 3.77–5.28)
RDW: 17.4 % — AB (ref 12.3–15.4)
WBC: 4.4 10*3/uL (ref 3.4–10.8)

## 2016-12-22 LAB — BASIC METABOLIC PANEL
BUN / CREAT RATIO: 13 (ref 12–28)
BUN: 13 mg/dL (ref 8–27)
CO2: 23 mmol/L (ref 20–29)
Calcium: 8.7 mg/dL (ref 8.7–10.3)
Chloride: 101 mmol/L (ref 96–106)
Creatinine, Ser: 1.04 mg/dL — ABNORMAL HIGH (ref 0.57–1.00)
GFR calc non Af Amer: 51 mL/min/{1.73_m2} — ABNORMAL LOW (ref >59)
GFR, EST AFRICAN AMERICAN: 59 mL/min/{1.73_m2} — AB (ref >59)
Glucose: 256 mg/dL — ABNORMAL HIGH (ref 65–99)
POTASSIUM: 4.4 mmol/L (ref 3.5–5.2)
Sodium: 140 mmol/L (ref 134–144)

## 2016-12-23 LAB — CULTURE, URINE COMPREHENSIVE

## 2016-12-29 ENCOUNTER — Encounter
Admission: RE | Admit: 2016-12-29 | Discharge: 2016-12-29 | Disposition: A | Discharge status: Home / Self Care | Payer: Medicare Other | Source: Ambulatory Visit | Attending: Urology | Admitting: Urology

## 2016-12-29 ENCOUNTER — Other Ambulatory Visit: Payer: No Typology Code available for payment source

## 2016-12-29 DIAGNOSIS — I059 Rheumatic mitral valve disease, unspecified: Secondary | ICD-10-CM | POA: Insufficient documentation

## 2016-12-29 DIAGNOSIS — E119 Type 2 diabetes mellitus without complications: Secondary | ICD-10-CM | POA: Insufficient documentation

## 2016-12-29 DIAGNOSIS — Z01810 Encounter for preprocedural cardiovascular examination: Secondary | ICD-10-CM | POA: Insufficient documentation

## 2016-12-29 DIAGNOSIS — I359 Nonrheumatic aortic valve disorder, unspecified: Secondary | ICD-10-CM | POA: Insufficient documentation

## 2016-12-29 DIAGNOSIS — G610 Guillain-Barre syndrome: Secondary | ICD-10-CM | POA: Insufficient documentation

## 2016-12-29 DIAGNOSIS — I4891 Unspecified atrial fibrillation: Secondary | ICD-10-CM | POA: Diagnosis not present

## 2016-12-29 DIAGNOSIS — I272 Pulmonary hypertension, unspecified: Secondary | ICD-10-CM | POA: Diagnosis not present

## 2016-12-29 DIAGNOSIS — Z950 Presence of cardiac pacemaker: Secondary | ICD-10-CM | POA: Diagnosis not present

## 2016-12-29 DIAGNOSIS — I4430 Unspecified atrioventricular block: Secondary | ICD-10-CM | POA: Diagnosis not present

## 2016-12-29 DIAGNOSIS — Z01812 Encounter for preprocedural laboratory examination: Secondary | ICD-10-CM | POA: Insufficient documentation

## 2016-12-29 HISTORY — DX: Anxiety disorder, unspecified: F41.9

## 2016-12-29 HISTORY — DX: Dyspnea, unspecified: R06.00

## 2016-12-29 HISTORY — DX: Nonrheumatic aortic valve disorder, unspecified: I35.9

## 2016-12-29 HISTORY — DX: Presence of cardiac pacemaker: Z95.0

## 2016-12-29 HISTORY — DX: Personal history of urinary calculi: Z87.442

## 2016-12-29 LAB — PROTIME-INR
INR: 1.99
Prothrombin Time: 22.4 seconds — ABNORMAL HIGH (ref 11.4–15.2)

## 2016-12-29 NOTE — Patient Instructions (Signed)
Your procedure is scheduled on: January 04, 2017 [facility]) Report to Same Day Surgery 2nd floor medical mall ([facility]tr[facility]-take elevator on left to 2nd floor.  Check in with surgery information desk.) To find out your arrival time please call [phone] between 1PM - 3PM on January 03, 2017 [facility] )  Remember: Instructions that are not followed completely may result in serious medical risk, up to and including death, or upon the discretion of your surgeon and anesthesiologist your surgery may need to be rescheduled.    _x___ 1. Do not eat food after midnight the night before your procedure. You may drink clear liquids up to 2 hours before you are scheduled to arrive at the hospital for your procedure.  Do not drink clear liquids within 2 hours of your scheduled arrival to the hospital.  Clear liquids include  --Water or Apple juice without pulp  --Clear carbohydrate beverage such as ClearFast or Gatorade  --Black Coffee or Clear Tea (No milk, no creamers, do not add anything to                  the coffee or Tea Type 1 and type 2 diabetics should only drink water.  No gum chewing or hard candies.     __x__ 2. No Alcohol for 24 hours before or after surgery.   __x__3. No Smoking for 24 prior to surgery.   ____  4. Bring all medications with you on the day of surgery if instructed.    __x__ 5. Notify your doctor if there is any change in your medical condition     (cold, fever, infections).     Do not wear jewelry, make-up, hairpins, clips or nail polish.  Do not wear lotions, powders, or perfumes. You may wear deodorant.  Do not shave 48 hours prior to surgery. Men may shave face and neck.  Do not bring valuables to the hospital.    [facility] is not responsible for any belongings or valuables.               Contacts, dentures or bridgework may not be worn into surgery.  Leave your suitcase in the car. After surgery it may be brought to your room.  For patients  admitted to the hospital, discharge time is determined by your treatment team                      Patients discharged the day of surgery will not be allowed to drive home.  You will need someone to drive you home and stay with you the night of your procedure.    Please read over the following fact sheets that you were given:   [facility] Preparing for Surgery and or MRSA Information   _x___ TAKE THE FOLLOWING MEDICATIONS THE MORNING OF SURGERY WITH A SIP OF WATER :   1.DIGOXIN  2.LEVOTHYROXINE  3.VERAPAMIL      ____Fleets enema or Magnesium Citrate as directed.   ___ Use CHG Soap or sage wipes as directed on instruction sheet   ____ Use inhalers on the day of surgery and bring to hospital day of surgery  ____ Stop Metformin and Janumet 2 days prior to surgery.    ____ Take 1/2 of usual insulin dose the night before surgery and none on the morning     surgery.   _x___ Follow recommendations from Cardiologist, Pulmonologist or PCP regarding          stopping Aspirin,  Coumadin, Plavix ,Eliquis, Effient, or Pradaxa, and Pletal. (PATIENT INSTRUCTED TO STOP WARFARIN ON SEPTEMBER  13 AND START LOVENOX INJECTIONS ON SEPTEMBER  14 ).  X____Stop Anti-inflammatories such as Advil, Aleve, Ibuprofen, Motrin, Naproxen, Naprosyn, Goodies powders or aspirin products. OK to take Tylenol    _x___ Stop supplements until after surgery.  But may continue Vitamin D, Vitamin B,  and multivitamin     ____ Bring C-Pap to the hospital.

## 2016-12-29 NOTE — Pre-Procedure Instructions (Signed)
Component Name Value Ref Range  Vent Rate (bpm) 66   QRS Interval (msec) 194   QT Interval (msec) 528   QTc (msec) 553   Result Narrative  Ventricular-paced rhythm Abnormal ECG When compared with ECG of 06-Nov-2015 15:09, No significant change was found I reviewed and concur with this report. Electronically signed FR:TMYT MD, KEN [phone]) on 04/23/2016 12:49:16 PM

## 2016-12-30 NOTE — Pre-Procedure Instructions (Addendum)
CLEARED BY DR Ubaldo Glassing AND PERIOP PACEMAKER SHEET ON CHART. PT RESULTS FAXED TO DR Erlene Quan

## 2017-01-03 MED ORDER — CIPROFLOXACIN IN D5W 400 MG/200ML IV SOLN
400.0000 mg | INTRAVENOUS | Status: AC
  Administered 2017-01-04: 400 mg via INTRAVENOUS

## 2017-01-04 ENCOUNTER — Ambulatory Visit: Payer: Medicare Other | Admitting: Anesthesiology

## 2017-01-04 ENCOUNTER — Ambulatory Visit
Admission: RE | Admit: 2017-01-04 | Discharge: 2017-01-04 | Disposition: A | Discharge status: Home / Self Care | Payer: Medicare Other | Source: Ambulatory Visit | Attending: Urology | Admitting: Urology

## 2017-01-04 ENCOUNTER — Encounter
Admission: RE | Disposition: A | Discharge status: Home / Self Care | Payer: Self-pay | Source: Ambulatory Visit | Attending: Urology

## 2017-01-04 ENCOUNTER — Encounter: Payer: Self-pay | Admitting: *Deleted

## 2017-01-04 DIAGNOSIS — I712 Thoracic aortic aneurysm, without rupture: Secondary | ICD-10-CM | POA: Diagnosis not present

## 2017-01-04 DIAGNOSIS — Z7901 Long term (current) use of anticoagulants: Secondary | ICD-10-CM | POA: Insufficient documentation

## 2017-01-04 DIAGNOSIS — F329 Major depressive disorder, single episode, unspecified: Secondary | ICD-10-CM | POA: Diagnosis not present

## 2017-01-04 DIAGNOSIS — I110 Hypertensive heart disease with heart failure: Secondary | ICD-10-CM | POA: Insufficient documentation

## 2017-01-04 DIAGNOSIS — I4891 Unspecified atrial fibrillation: Secondary | ICD-10-CM | POA: Diagnosis not present

## 2017-01-04 DIAGNOSIS — E1140 Type 2 diabetes mellitus with diabetic neuropathy, unspecified: Secondary | ICD-10-CM | POA: Insufficient documentation

## 2017-01-04 DIAGNOSIS — K7460 Unspecified cirrhosis of liver: Secondary | ICD-10-CM | POA: Diagnosis not present

## 2017-01-04 DIAGNOSIS — N132 Hydronephrosis with renal and ureteral calculous obstruction: Secondary | ICD-10-CM | POA: Insufficient documentation

## 2017-01-04 DIAGNOSIS — Z950 Presence of cardiac pacemaker: Secondary | ICD-10-CM | POA: Diagnosis not present

## 2017-01-04 DIAGNOSIS — Z87442 Personal history of urinary calculi: Secondary | ICD-10-CM | POA: Diagnosis not present

## 2017-01-04 DIAGNOSIS — Z79899 Other long term (current) drug therapy: Secondary | ICD-10-CM | POA: Insufficient documentation

## 2017-01-04 DIAGNOSIS — N201 Calculus of ureter: Secondary | ICD-10-CM

## 2017-01-04 DIAGNOSIS — E785 Hyperlipidemia, unspecified: Secondary | ICD-10-CM | POA: Diagnosis not present

## 2017-01-04 DIAGNOSIS — Z880 Allergy status to penicillin: Secondary | ICD-10-CM | POA: Diagnosis not present

## 2017-01-04 DIAGNOSIS — K5730 Diverticulosis of large intestine without perforation or abscess without bleeding: Secondary | ICD-10-CM | POA: Diagnosis not present

## 2017-01-04 DIAGNOSIS — K766 Portal hypertension: Secondary | ICD-10-CM | POA: Insufficient documentation

## 2017-01-04 DIAGNOSIS — F419 Anxiety disorder, unspecified: Secondary | ICD-10-CM | POA: Diagnosis not present

## 2017-01-04 DIAGNOSIS — Z952 Presence of prosthetic heart valve: Secondary | ICD-10-CM | POA: Insufficient documentation

## 2017-01-04 DIAGNOSIS — I509 Heart failure, unspecified: Secondary | ICD-10-CM | POA: Diagnosis not present

## 2017-01-04 DIAGNOSIS — E039 Hypothyroidism, unspecified: Secondary | ICD-10-CM | POA: Insufficient documentation

## 2017-01-04 HISTORY — PX: CYSTOSCOPY/URETEROSCOPY/HOLMIUM LASER/STENT PLACEMENT: SHX6546

## 2017-01-04 LAB — PROTIME-INR
INR: 1.22
Prothrombin Time: 15.3 seconds — ABNORMAL HIGH (ref 11.4–15.2)

## 2017-01-04 LAB — GLUCOSE, CAPILLARY
Glucose-Capillary: 134 mg/dL — ABNORMAL HIGH (ref 65–99)
Glucose-Capillary: 167 mg/dL — ABNORMAL HIGH (ref 65–99)

## 2017-01-04 SURGERY — CYSTOSCOPY/URETEROSCOPY/HOLMIUM LASER/STENT PLACEMENT
Anesthesia: General | Laterality: Left

## 2017-01-04 MED ORDER — FENTANYL CITRATE (PF) 100 MCG/2ML IJ SOLN: 25.0000 ug | INTRAMUSCULAR | Status: DC | PRN

## 2017-01-04 MED ORDER — MIDAZOLAM HCL 2 MG/2ML IJ SOLN
INTRAMUSCULAR | Status: AC
  Filled 2017-01-04: qty 2

## 2017-01-04 MED ORDER — SUCCINYLCHOLINE CHLORIDE 20 MG/ML IJ SOLN
INTRAMUSCULAR | Status: AC
  Filled 2017-01-04: qty 1

## 2017-01-04 MED ORDER — ROCURONIUM BROMIDE 50 MG/5ML IV SOLN
INTRAVENOUS | Status: AC
  Filled 2017-01-04: qty 1

## 2017-01-04 MED ORDER — ONDANSETRON HCL 4 MG/2ML IJ SOLN
INTRAMUSCULAR | Status: DC | PRN
  Administered 2017-01-04: 4 mg via INTRAVENOUS

## 2017-01-04 MED ORDER — SODIUM CHLORIDE 0.9 % IV SOLN
INTRAVENOUS | Status: DC
  Administered 2017-01-04 (×2): via INTRAVENOUS

## 2017-01-04 MED ORDER — OXYBUTYNIN CHLORIDE 5 MG PO TABS: 5.0000 mg | ORAL_TABLET | Freq: Three times a day (TID) | ORAL | 0 refills | Status: DC | PRN

## 2017-01-04 MED ORDER — FENTANYL CITRATE (PF) 100 MCG/2ML IJ SOLN
INTRAMUSCULAR | Status: DC | PRN
  Administered 2017-01-04: 50 ug via INTRAVENOUS

## 2017-01-04 MED ORDER — HYDROCODONE-ACETAMINOPHEN 5-325 MG PO TABS: 1.0000 | ORAL_TABLET | Freq: Four times a day (QID) | ORAL | 0 refills | Status: DC | PRN

## 2017-01-04 MED ORDER — PROPOFOL 10 MG/ML IV BOLUS
INTRAVENOUS | Status: AC
  Filled 2017-01-04: qty 40

## 2017-01-04 MED ORDER — FENTANYL CITRATE (PF) 100 MCG/2ML IJ SOLN
INTRAMUSCULAR | Status: AC
  Filled 2017-01-04: qty 2

## 2017-01-04 MED ORDER — OXYCODONE HCL 5 MG PO TABS: 5.0000 mg | ORAL_TABLET | Freq: Once | ORAL | Status: DC | PRN

## 2017-01-04 MED ORDER — PHENYLEPHRINE HCL 10 MG/ML IJ SOLN
INTRAMUSCULAR | Status: AC
  Filled 2017-01-04: qty 1

## 2017-01-04 MED ORDER — CIPROFLOXACIN IN D5W 400 MG/200ML IV SOLN
INTRAVENOUS | Status: AC
  Filled 2017-01-04: qty 200

## 2017-01-04 MED ORDER — LIDOCAINE HCL (CARDIAC) 20 MG/ML IV SOLN
INTRAVENOUS | Status: DC | PRN
  Administered 2017-01-04: 70 mg via INTRAVENOUS

## 2017-01-04 MED ORDER — FAMOTIDINE 20 MG PO TABS
ORAL_TABLET | ORAL | Status: AC
  Administered 2017-01-04: 20 mg via ORAL
  Filled 2017-01-04: qty 1

## 2017-01-04 MED ORDER — PROPOFOL 10 MG/ML IV BOLUS
INTRAVENOUS | Status: DC | PRN
  Administered 2017-01-04: 30 mg via INTRAVENOUS
  Administered 2017-01-04: 90 mg via INTRAVENOUS

## 2017-01-04 MED ORDER — TAMSULOSIN HCL 0.4 MG PO CAPS: 0.4000 mg | ORAL_CAPSULE | Freq: Every day | ORAL | 0 refills | Status: DC

## 2017-01-04 MED ORDER — FAMOTIDINE 20 MG PO TABS
20.0000 mg | ORAL_TABLET | Freq: Once | ORAL | Status: AC
  Administered 2017-01-04: 20 mg via ORAL

## 2017-01-04 MED ORDER — LIDOCAINE HCL (PF) 2 % IJ SOLN
INTRAMUSCULAR | Status: AC
  Filled 2017-01-04: qty 4

## 2017-01-04 MED ORDER — DOCUSATE SODIUM 100 MG PO CAPS: 100.0000 mg | ORAL_CAPSULE | Freq: Two times a day (BID) | ORAL | 0 refills | Status: DC

## 2017-01-04 MED ORDER — PHENYLEPHRINE HCL 10 MG/ML IJ SOLN
INTRAMUSCULAR | Status: DC | PRN
  Administered 2017-01-04: 100 ug via INTRAVENOUS

## 2017-01-04 MED ORDER — OXYCODONE HCL 5 MG/5ML PO SOLN: 5.0000 mg | Freq: Once | ORAL | Status: DC | PRN

## 2017-01-04 SURGICAL SUPPLY — 30 items
BAG DRAIN CYSTO-URO LG1000N (MISCELLANEOUS) ×3 IMPLANT
BASKET ZERO TIP 1.9FR (BASKET) IMPLANT
BRUSH SCRUB EZ 1% IODOPHOR (MISCELLANEOUS) ×3 IMPLANT
CATH URETL 5X70 OPEN END (CATHETERS) ×3 IMPLANT
CNTNR SPEC 2.5X3XGRAD LEK (MISCELLANEOUS)
CONRAY 43 FOR UROLOGY 50M (MISCELLANEOUS) ×3 IMPLANT
CONT SPEC 4OZ STER OR WHT (MISCELLANEOUS)
CONTAINER SPEC 2.5X3XGRAD LEK (MISCELLANEOUS) IMPLANT
DRAPE UTILITY 15X26 TOWEL STRL (DRAPES) ×3 IMPLANT
FIBER LASER LITHO 273 (Laser) ×3 IMPLANT
GLOVE BIO SURGEON STRL SZ 6.5 (GLOVE) ×6 IMPLANT
GLOVE BIO SURGEONS STRL SZ 6.5 (GLOVE) ×3
GOWN STRL REUS W/ TWL LRG LVL3 (GOWN DISPOSABLE) ×2 IMPLANT
GOWN STRL REUS W/TWL LRG LVL3 (GOWN DISPOSABLE) ×4
GUIDEWIRE GREEN .038 145CM (MISCELLANEOUS) IMPLANT
INFUSOR MANOMETER BAG 3000ML (MISCELLANEOUS) ×3 IMPLANT
INTRODUCER DILATOR DOUBLE (INTRODUCER) IMPLANT
KIT RM TURNOVER CYSTO AR (KITS) ×3 IMPLANT
PACK CYSTO AR (MISCELLANEOUS) ×3 IMPLANT
SENSORWIRE 0.038 NOT ANGLED (WIRE) ×3
SET CYSTO W/LG BORE CLAMP LF (SET/KITS/TRAYS/PACK) ×3 IMPLANT
SHEATH URETERAL 12FRX35CM (MISCELLANEOUS) IMPLANT
SOL .9 NS 3000ML IRR  AL (IV SOLUTION) ×2
SOL .9 NS 3000ML IRR UROMATIC (IV SOLUTION) ×1 IMPLANT
STENT URET 6FRX24 CONTOUR (STENTS) ×3 IMPLANT
STENT URET 6FRX26 CONTOUR (STENTS) IMPLANT
SURGILUBE 2OZ TUBE FLIPTOP (MISCELLANEOUS) ×3 IMPLANT
WATER STERILE IRR 1000ML POUR (IV SOLUTION) ×3 IMPLANT
WIRE SENSOR 0.038 NOT ANGLED (WIRE) ×1 IMPLANT
contour stent 6x24cm ×3 IMPLANT

## 2017-01-04 NOTE — Discharge Instructions (Signed)
AMBULATORY SURGERY  °DISCHARGE INSTRUCTIONS ° ° °1) The drugs that you were given will stay in your system until tomorrow so for the next 24 hours you should not: ° °A) Drive an automobile °B) Make any legal decisions °C) Drink any alcoholic beverage ° ° °2) You may resume regular meals tomorrow.  Today it is better to start with liquids and gradually work up to solid foods. ° °You may eat anything you prefer, but it is better to start with liquids, then soup and crackers, and gradually work up to solid foods. ° ° °3) Please notify your doctor immediately if you have any unusual bleeding, trouble breathing, redness and pain at the surgery site, drainage, fever, or pain not relieved by medication. °4)  ° °5) Your post-operative visit with Dr.                     °           °     is: Date:                        Time:   ° °Please call to schedule your post-operative visit. ° °6) Additional Instructions: °You have a ureteral stent in place.  This is a tube that extends from your kidney to your bladder.  This may cause urinary bleeding, burning with urination, and urinary frequency.  Please call our office or present to the ED if you develop fevers >101 or pain which is not able to be controlled with oral pain medications.  You may be given either Flomax and/ or ditropan to help with bladder spasms and stent pain in addition to pain medications.   ° °[facility] °[address], [address] °[place], [place] [address] °[phone] ° °

## 2017-01-04 NOTE — Anesthesia Postprocedure Evaluation (Signed)
Anesthesia Post Note  Patient: Jodi Waters  Procedure(s) Performed: Procedure(s) (LRB): CYSTOSCOPY/URETEROSCOPY/HOLMIUM LASER/STENT PLACEMENT (Left)  Patient location during evaluation: PACU Anesthesia Type: General Level of consciousness: awake and alert Pain management: pain level controlled Vital Signs Assessment: post-procedure vital signs reviewed and stable Respiratory status: spontaneous breathing, nonlabored ventilation, respiratory function stable and patient connected to nasal cannula oxygen Cardiovascular status: blood pressure returned to baseline and stable Postop Assessment: no apparent nausea or vomiting Anesthetic complications: no     Last Vitals:  Vitals:   01/04/17 0957 01/04/17 1034  BP: 137/63 (!) 116/50  Pulse: 66 (!) 59  Resp: 14 14  Temp: (!) 36.2 C   SpO2: 97% 97%    Last Pain:  Vitals:   01/04/17 1034  TempSrc:   PainSc: 0-No pain                 Precious Haws Elby Blackwelder

## 2017-01-04 NOTE — H&P (View-Only) (Signed)
  12/21/2016 12:05 PM   Jodi Waters 12/31/1934 [id number]  Referring provider: Hedrick, James, MD [address] [facility] [place], [place] [address]  Chief Complaint  Patient presents with  . New Patient (Initial Visit)    Kidney stones referred by Dr. Herbon Fleming    HPI: Patient is a 81 year old Caucasian female who is referred by Dr. Herbon Fleming for nephrolithiasis with her daughter, [name].    She underwent a chest CT for an evaluation for interstitial lung disease on 11/29/2016 which noted left hydronephrosis.  Follow up RUS confirmed the left hydronephrosis.  Follow up contrast CT noted severe left hydroureteronephrosis due to 5 mm distal left ureteral calculus.   She also has PMHx significant for DM, cardiomegaly with pleural effusions, cirrhosis, a 4.1 cm ascending thoracic aortic aneurysm and a compression fracture of indeterminate age of L1 vertebral body.  Current Cr is 1.0.    Today, she states that she has not experienced any pain. She has not had any gross hematuria.  She has not had fevers, chills, nausea or vomiting.  UA today is unremarkable.    She does have a prior history of stones - she spontaneously passed one several years ago.   Stone composition is unknown.       Reviewed referral notes - see above.     PMH: Past Medical History:  Diagnosis Date  . CHF (congestive heart failure) (HCC)   . Depression   . Diabetes mellitus without complication (HCC)   . Dysrhythmia    afib  . Guillain Barr syndrome (HCC)   . Hyperlipidemia   . Hypertension   . Hypothyroidism   . Migraine   . Peripheral neuropathy     Surgical History: Past Surgical History:  Procedure Laterality Date  . ABDOMINAL HYSTERECTOMY    . CARDIAC CATHETERIZATION N/A 06/05/2015   Procedure: Right Heart Cath;  Surgeon: Kenneth A Fath, MD;  Location: [facility];  Service: Cardiovascular;  Laterality: N/A;  . CARDIAC SURGERY    . CARDIAC VALVE REPLACEMENT     mitral and aortic valve  . CATARACT EXTRACTION    . INSERT / REPLACE / REMOVE PACEMAKER    . ORIF WRIST FRACTURE Left 10/31/2015   Procedure: OPEN REDUCTION INTERNAL FIXATION (ORIF) WRIST FRACTURE;  Surgeon: Michael Menz, MD;  Location: [facility] ORS;  Service: Orthopedics;  Laterality: Left;  . PACEMAKER INSERTION    . PACEMAKER INSERTION  2012   patient states still has this one 12/21/2016  . TONSILLECTOMY      Home Medications:  Allergies as of 12/21/2016      Reactions   Penicillins    Has patient had a PCN reaction causing immediate rash, facial/tongue/throat swelling, SOB or lightheadedness with hypotension: Yes Has patient had a PCN reaction causing severe rash involving mucus membranes or skin necrosis: No Has patient had a PCN reaction that required hospitalization Yes Has patient had a PCN reaction occurring within the last 10 years: Yes If all of the above answers are "NO", then may proceed with Cephalosporin use.      Medication List       Accurate as of 12/21/16 12:05 PM. Always use your most recent med list.          clindamycin 300 MG capsule Commonly known as:  CLEOCIN Take 1 capsule (300 mg total) by mouth 2 (two) times daily.   digoxin 0.125 MG tablet Commonly known as:  LANOXIN Take 1 mg by   mouth every morning.   doxycycline 100 MG tablet Commonly known as:  VIBRA-TABS Take 1 tablet by mouth 2 (two) times daily.   fluticasone 50 MCG/ACT nasal spray Commonly known as:  FLONASE Place into the nose.   furosemide 40 MG tablet Commonly known as:  LASIX Take 40 mg by mouth 2 (two) times daily.   gabapentin 100 MG capsule Commonly known as:  NEURONTIN Take 200 mg by mouth every evening.   HYDROcodone-acetaminophen 5-325 MG tablet Commonly known as:  NORCO Take 1 tablet by mouth every 6 (six) hours as needed for moderate pain.   levothyroxine 50 MCG tablet Commonly known as:  SYNTHROID, LEVOTHROID Take 50 mcg by mouth daily before breakfast.   losartan  25 MG tablet Commonly known as:  COZAAR Take 25 mg by mouth daily.   PARoxetine 20 MG tablet Commonly known as:  PAXIL Take 20 mg by mouth every evening.   simvastatin 20 MG tablet Commonly known as:  ZOCOR Take 20 mg by mouth every evening.   verapamil 180 MG 24 hr capsule Commonly known as:  VERELAN PM Take 180 mg by mouth daily.   VIRTUSSIN A/C 100-10 MG/5ML syrup Generic drug:  guaiFENesin-codeine Take 10 mLs by mouth every 6 (six) hours as needed.   warfarin 4 MG tablet Commonly known as:  COUMADIN Take 4 mg by mouth daily.   warfarin 1 MG tablet Commonly known as:  COUMADIN Take 0.5 mg by mouth.            Discharge Care Instructions        Start     Ordered   12/21/16 0000  Urinalysis, Complete     12/21/16 1133   12/21/16 0000  CULTURE, URINE COMPREHENSIVE     12/21/16 1133   12/21/16 0000  Basic metabolic panel     12/21/16 1133   12/21/16 0000  CBC with Differential/Platelet     12/21/16 1133      Allergies:  Allergies  Allergen Reactions  . Penicillins     Has patient had a PCN reaction causing immediate rash, facial/tongue/throat swelling, SOB or lightheadedness with hypotension: Yes Has patient had a PCN reaction causing severe rash involving mucus membranes or skin necrosis: No Has patient had a PCN reaction that required hospitalization Yes Has patient had a PCN reaction occurring within the last 10 years: Yes If all of the above answers are "NO", then may proceed with Cephalosporin use.     Family History: Family History  Problem Relation Age of Onset  . Bone cancer Maternal Uncle   . Bone cancer Maternal Grandmother   . Kidney cancer Neg Hx   . Bladder Cancer Neg Hx     Social History:  reports that she has never smoked. She has never used smokeless tobacco. She reports that she does not drink alcohol or use drugs.  ROS: UROLOGY Frequent Urination?: Yes Hard to postpone urination?: Yes Burning/pain with urination?: No Get  up at night to urinate?: Yes Leakage of urine?: Yes Urine stream starts and stops?: No Trouble starting stream?: No Do you have to strain to urinate?: No Blood in urine?: No Urinary tract infection?: No Sexually transmitted disease?: No Injury to kidneys or bladder?: No Painful intercourse?: No Weak stream?: No Currently pregnant?: No Vaginal bleeding?: No Last menstrual period?: n  Gastrointestinal Nausea?: No Vomiting?: No Indigestion/heartburn?: No Diarrhea?: No Constipation?: No  Constitutional Fever: No Night sweats?: No Weight loss?: No Fatigue?: No  Skin Skin rash/lesions?: No   Itching?: No  Eyes Blurred vision?: No Double vision?: No  Ears/Nose/Throat Sore throat?: No Sinus problems?: Yes  Hematologic/Lymphatic Swollen glands?: No Easy bruising?: Yes  Cardiovascular Leg swelling?: Yes Chest pain?: No  Respiratory Cough?: Yes Shortness of breath?: Yes  Endocrine Excessive thirst?: No  Musculoskeletal Back pain?: No Joint pain?: No  Neurological Headaches?: Yes Dizziness?: No  Psychologic Depression?: Yes Anxiety?: No  Physical Exam: BP (!) 159/68   Pulse 97   Ht 5' 4" (1.626 m)   Wt 154 lb 1.6 oz (69.9 kg)   BMI 26.45 kg/m   Constitutional: Well nourished. Alert and oriented, No acute distress. HEENT: [place] AT, moist mucus membranes. Trachea midline, no masses. Cardiovascular: No clubbing, cyanosis, or edema. Respiratory: Normal respiratory effort, no increased work of breathing. GI: Abdomen is soft, non tender, non distended, no abdominal masses. Liver and spleen not palpable.  No hernias appreciated.  Stool sample for occult testing is not indicated.   GU: No CVA tenderness.  No bladder fullness or masses.   Skin: No rashes, bruises or suspicious lesions. Lymph: No cervical or inguinal adenopathy. Neurologic: Grossly intact, no focal deficits, moving all 4 extremities. Psychiatric: Normal mood and affect.  Laboratory  Data: Lab Results  Component Value Date   WBC 5.4 07/05/2016   HGB 11.4 (L) 07/05/2016   HCT 33.4 (L) 07/05/2016   MCV 90.4 07/05/2016   PLT 127 (L) 07/05/2016    Lab Results  Component Value Date   CREATININE 1.06 (H) 07/05/2016    Urinalysis Unremarkable.  See EPIC.   I have reviewed the labs.   Pertinent Imaging: CLINICAL DATA:  Severe left hydronephrosis on ultrasound. Nephrolithiasis.  EXAM: CT ABDOMEN AND PELVIS WITH CONTRAST  TECHNIQUE: Multidetector CT imaging of the abdomen and pelvis was performed using the standard protocol following bolus administration of intravenous contrast.  CONTRAST:  100mL ISOVUE-300 IOPAMIDOL (ISOVUE-300) INJECTION 61%  COMPARISON:  Ultrasound 12/01/2016 and chest CT on 11/29/2016  FINDINGS: Lower Chest: Severe cardiomegaly which is stable. Tiny right pleural effusion, significantly decreased in size since recent chest CT. Distention of IVC and hepatic veins, consistent with right heart insufficiency.  Hepatobiliary: Hepatic cirrhosis. No liver masses identified. Gallbladder is unremarkable.  Pancreas:  No mass or inflammatory changes.  Spleen: Within normal limits in size and appearance.  Adrenals/Urinary Tract: No masses identified. A few tiny sub-cm right renal cysts are noted. Two tiny less than 5 mm nonobstructing calculi are seen in the right kidney however there is no evidence of right-sided ureteral calculi or hydronephrosis. Severe left hydroureteronephrosis is seen due to a 5 mm calculus in the distal left ureter. Unremarkable unopacified urinary bladder.  Stomach/Bowel: No evidence of obstruction, inflammatory process or abnormal fluid collections. Sigmoid diverticulosis noted, without evidence of diverticulitis.  Vascular/Lymphatic: No pathologically enlarged lymph nodes. No abdominal aortic aneurysm. Aortic atherosclerosis. Upper abdominal and esophageal varices, and recanalization of  paraumbilical veins, consistent with portal venous hypertension.  Reproductive: Prior hysterectomy noted. Adnexal regions are unremarkable in appearance.  Other:  None.  Musculoskeletal: No suspicious bone lesions identified. L1 vertebral compression fracture of indeterminate age, which is stable since recent exam.  IMPRESSION: Severe left hydroureteronephrosis due to 5 mm distal left ureteral calculus.  Small nonobstructing right renal calculi.  Cirrhosis and findings of portal venous hypertension. No evidence of hepatic neoplasm.  Severe cardiomegaly with signs of right heart insufficiency. Significant decrease in size of right pleural effusion since recent chest CT.  Colonic diverticulosis, without radiographic evidence of diverticulitis.    L1 vertebral body compression fracture of indeterminate age, but stable since recent chest CT.   Electronically Signed   By: John  Stahl M.D.   On: 12/16/2016 09:23 I have independently reviewed the films.    Assessment & Plan:    Patient will be scheduled for a left ureteroscopy with laser lithotripsy with ureteral stent placement for her obstructing 5 mm left ureteral stone.    1. Left ureteral stone  - explained to the patient that AUA Guidelines for patients with uncomplicated ureteral stones ?10 mm should be offered observation, and those with distal stones of similar size should be offered MET with ?-blockers - she is not a candidate for MET therapy as after reviewing the CT images it appears that the obstruction caused by the stone has been long-standing  - if after 4 to 6 weeks observation with or without MET is not successful we would pursue URS or ESWL, if the patient/clinician decide to intervene sooner based on a shared decision making approach, the clinicians should offer definitive stone treatment - she is not a candidate for ESWL due to her anticoagulation therapy for artificial heart valves  - schedule left  ureteroscopy with laser lithotripsy and ureteral stent placement  - explained to the patient how the procedure is performed and the risks involved  - informed patient that they will have a stent placed during the procedure and will remain in place after the procedure for a short time.   - stent may be removed in the office with a cystoscope or patient may be instructed to remove the stent themselves by the string  - described "stent pain" as feelings of needing to urinate/overactive bladder and a warm, tingling sensation to intense pain in the affected flank  - residual stones within the kidney or ureter may be present after the procedure and may need to have these addressed at a different encounter  - injury to the ureter is the most common intra-operative risk, it may result in an open procedure to correct the defect  - infection and bleeding are also risks  - explained the risks of general anesthesia, such as: MI, CVA, paralysis, coma and/or death.  - advised to contact our office or seek treatment in the ED if becomes febrile or pain/ vomiting are difficult control in order to arrange for emergent/urgent intervention   2. Left hydronephrosis  - obtain RUS to ensure the hydronephrosis has resolved once she has recovered from URS/LL/ureteral stent placment   3. History of nephrolithiasis  - will offer 24 hour urine once patient has recovered  4. Cardiomegaly/artificial heart valves/pacemarker  - will need cardiac clearance and Lovenox bridge   No Follow-up on file.  These notes generated with voice recognition software. I apologize for typographical errors.  Adaliah Hiegel, PA-C  [facility] [address], [address] [place], [place] [address] [phone]  

## 2017-01-04 NOTE — Interval H&P Note (Signed)
History and Physical Interval Note:  01/04/2017 7:26 AM  Jodi Waters  has presented today for surgery, with the diagnosis of Left ureteral stone  The various methods of treatment have been discussed with the patient and family. After consideration of risks, benefits and other options for treatment, the patient has consented to  Procedure(s): CYSTOSCOPY/URETEROSCOPY/HOLMIUM LASER/STENT PLACEMENT (Left) as a surgical intervention .  The patient's history has been reviewed, patient examined, no change in status, stable for surgery.  I have reviewed the patient's chart and labs.  Questions were answered to the patient's satisfaction.    RRR CTAB  Hollice Espy

## 2017-01-04 NOTE — Op Note (Signed)
Date of procedure: 01/04/17  Preoperative diagnosis:  1. Left ureteral stone   Postoperative diagnosis:  1. Same as above 2. Impacted stone with high-grade ureteral obstruction   Procedure: 1. Left ureteroscopy, laser lithotripsy 2. Left ureteral stent placement 3. Left retrograde pyelogram  Surgeon: Hollice Espy, MD  Anesthesia: General  Complications: None  Intraoperative findings: severely impacted left distal ureteral stone with bolus edema, high-grade obstruction. Unable to adequately treat the stone. Plan for staged procedure.  EBL: minimal  Specimens: none  Drains: 6 x 24 French double-J ureteral stent on left  Indication: Jodi Waters is a 82 y.o. patient with severe hydroureteronephrosis secondary to a 5 mm left distal obstructing ureteral calculus.  After reviewing the management options for treatment, she elected to proceed with the above surgical procedure(s). We have discussed the potential benefits and risks of the procedure, side effects of the proposed treatment, the likelihood of the patient achieving the goals of the procedure, and any potential problems that might occur during the procedure or recuperation. Informed consent has been obtained.  She has been on a Lovenox bridge by her primary care in anticipation of the procedure today.  Description of procedure:  The patient was taken to the operating room and general anesthesia was induced.  The patient was placed in the dorsal lithotomy position, prepped and draped in the usual sterile fashion, and preoperative antibiotics were administered. A preoperative time-out was performed.   A 21 French scope was advanced per urethra into the bladder. Attention was turned to the left ureteral orifice which was cannulated using a 5 [place] open-ended ureteral catheter. A sensor wire was then placed beyond the level of the stone. Upon wire placement, the stone could be felt. The cystotomy place as a safety wire. The stone  was not readily visible on scout imaging.A 4.5 semirigid ureteroscope was then advanced up to level of the stone. Unfortunately, there was severe bullous edema and visualization was somewhat poor. Using a 273  laser fiber using settings of 0.8 J and 10 Hz, the tip of the stone was lasered but was unable to visualize the remainder of the stone. It appeared to be embedded into the wall of the ureter. A second sensor wire was then advanced under direct visualization beyond the stone I attempted a railroad technique both over the wire in between the wires to get beyond the stone but was unsuccessful. Retrograde pyelogram showed no ureteral extravasation and minimal to no contrast above the level of the stent consistent with high-grade obstruction.  At this point in time, given poor visualization, highly impacted stone, and inability to pass the scope beyond the stone, I elected to place a stent and return for a staged procedure. A 6 x 24 French double-J ureteral stent was advanced over the safety wire up to level of the renal pelvis. The wire was fully withdrawn and a full coil was noted both within the renal pelvis as well as the bladder. The bladder was drained. She was then cleaned and dried, repositioned the supine position, reversed from anesthesia, taken to the PACU in stable condition.  Plan: She will return for second ureteroscopic procedure in approximately 2 weeks. We will arrange for another Lovenox bridge. Plan was discussed with her daughter extensively.  Hollice Espy, M.D.

## 2017-01-04 NOTE — Anesthesia Preprocedure Evaluation (Signed)
Anesthesia Evaluation  Patient identified by MRN, date of birth, ID band Patient awake    Reviewed: Allergy & Precautions, H&P , NPO status , Patient's Chart, lab work & pertinent test results  History of Anesthesia Complications Negative for: history of anesthetic complications  Airway Mallampati: III  TM Distance: >3 FB Neck ROM: limited    Dental  (+) Poor Dentition, Chipped, Missing   Pulmonary shortness of breath,           Cardiovascular hypertension, (-) angina+CHF  (-) Past MI + dysrhythmias Atrial Fibrillation + pacemaker      Neuro/Psych  Headaches, PSYCHIATRIC DISORDERS Anxiety Depression  Neuromuscular disease    GI/Hepatic negative GI ROS, Neg liver ROS,   Endo/Other  diabetes, Type 2Hypothyroidism   Renal/GU      Musculoskeletal   Abdominal   Peds  Hematology negative hematology ROS (+)   Anesthesia Other Findings Past Medical History: No date: Anxiety No date: AVD (aortic valve disease) No date: CHF (congestive heart failure) (HCC) No date: Depression No date: Diabetes mellitus without complication (HCC)     Comment:  diet controlled No date: Dyspnea     Comment:  with exertion No date: Dysrhythmia     Comment:  afib 01/2000: Guillain Barr syndrome ([place]) No date: History of kidney stones No date: Hyperlipidemia No date: Hypertension No date: Hypothyroidism No date: Migraine No date: Peripheral neuropathy No date: Presence of permanent cardiac pacemaker  Past Surgical History: No date: ABDOMINAL HYSTERECTOMY 06/05/2015: CARDIAC CATHETERIZATION; N/A     Comment:  Procedure: Right Heart Cath;  Surgeon: Teodoro Spray,               MD;  Location: [facility];  Service:               Cardiovascular;  Laterality: N/A; No date: CARDIAC SURGERY No date: CARDIAC VALVE REPLACEMENT     Comment:  mitral and aortic valve No date: CATARACT EXTRACTION No date: EYE SURGERY; Bilateral  Comment:  Cataract Extraction with IOL No date: INSERT / REPLACE / REMOVE PACEMAKER 10/31/2015: ORIF WRIST FRACTURE; Left     Comment:  Procedure: OPEN REDUCTION INTERNAL FIXATION (ORIF) WRIST              FRACTURE;  Surgeon: Hessie Knows, MD;  Location: [facility]               ORS;  Service: Orthopedics;  Laterality: Left; No date: PACEMAKER INSERTION 2012: PACEMAKER INSERTION     Comment:  patient states still has this one 12/21/2016 No date: TONSILLECTOMY     Reproductive/Obstetrics negative OB ROS                             Anesthesia Physical Anesthesia Plan  ASA: IV  Anesthesia Plan: General ETT   Post-op Pain Management:    Induction: Intravenous  PONV Risk Score and Plan: 4 or greater and Ondansetron, Dexamethasone and Treatment may vary due to age or medical condition  Airway Management Planned: Oral ETT  Additional Equipment:   Intra-op Plan:   Post-operative Plan: Extubation in OR  Informed Consent: I have reviewed the patients History and Physical, chart, labs and discussed the procedure including the risks, benefits and alternatives for the proposed anesthesia with the patient or authorized representative who has indicated his/her understanding and acceptance.   Dental Advisory Given  Plan Discussed with: Anesthesiologist, CRNA and Surgeon  Anesthesia Plan Comments: (Patient  consented for risks of anesthesia including but not limited to:  - adverse reactions to medications - damage to teeth, lips or other oral mucosa - sore throat or hoarseness - Damage to heart, brain, lungs or loss of life  Patient voiced understanding.)        Anesthesia Quick Evaluation

## 2017-01-04 NOTE — Anesthesia Post-op Follow-up Note (Signed)
Anesthesia QCDR form completed.        

## 2017-01-04 NOTE — Transfer of Care (Signed)
Immediate Anesthesia Transfer of Care Note  Patient: Jodi Waters  Procedure(s) Performed: Procedure(s): CYSTOSCOPY/URETEROSCOPY/HOLMIUM LASER/STENT PLACEMENT (Left)  Patient Location: PACU  Anesthesia Type:General  Level of Consciousness: drowsy and patient cooperative  Airway & Oxygen Therapy: Patient Spontanous Breathing and Patient connected to face mask oxygen  Post-op Assessment: Report given to RN and Post -op Vital signs reviewed and stable  Post vital signs: Reviewed and stable  Last Vitals:  Vitals:   01/04/17 0624  BP: (!) 168/70  Pulse: 86  Resp: 14  Temp: (!) 36.3 C  SpO2: 98%    Last Pain:  Vitals:   01/04/17 0624  TempSrc: Tympanic         Complications: No apparent anesthesia complications

## 2017-01-04 NOTE — Anesthesia Procedure Notes (Addendum)
Procedure Name: LMA Insertion Date/Time: 01/04/2017 8:12 AM Performed by: Silvana Newness Pre-anesthesia Checklist: Patient identified, Emergency Drugs available, Suction available, Patient being monitored and Timeout performed Patient Re-evaluated:Patient Re-evaluated prior to induction Oxygen Delivery Method: Circle system utilized Preoxygenation: Pre-oxygenation with 100% oxygen Induction Type: IV induction Ventilation: Mask ventilation without difficulty LMA: LMA inserted LMA Size: 4.0 Number of attempts: 1 Placement Confirmation: positive ETCO2 and breath sounds checked- equal and bilateral Tube secured with: Tape Dental Injury: Teeth and Oropharynx as per pre-operative assessment

## 2017-01-05 ENCOUNTER — Telehealth: Payer: Self-pay | Admitting: Radiology

## 2017-01-05 ENCOUNTER — Other Ambulatory Visit: Payer: Self-pay | Admitting: Radiology

## 2017-01-05 DIAGNOSIS — N201 Calculus of ureter: Secondary | ICD-10-CM

## 2017-01-05 NOTE — Telephone Encounter (Signed)
-----   Message from Hollice Espy, MD sent at 01/04/2017  9:09 AM EDT ----- Regarding: reschedule surgery Jodi Waters,  I was not able to get up this woman's ureter, stone highly impacted. I would like to reschedule gain for in 2 weeks. Same procedure,same orders.  Please have her work with her primary care to do the Lovenox bridge again.  Hollice Espy, MD

## 2017-01-05 NOTE — Telephone Encounter (Signed)
Surgery rescheduled to 01/17/17. Made pt's daughter aware of surgery date & that Dr Bethanne Ginger office will contact her regarding lovenox bridge prior to surgery. Questions answered. Daughter voices understanding.

## 2017-01-11 ENCOUNTER — Telehealth: Payer: Self-pay

## 2017-01-11 NOTE — Telephone Encounter (Signed)
Pt daughter, [name], called stating pt is having some small stringy like blood clots and was concerned about what to do. Reinforced with [name] pt should increase fluid in take as with a stent small clots are normal. Reinforced with [name] adverse side affects to look for with stent in place. [name] voiced understanding of whole conversation.

## 2017-01-16 MED ORDER — CIPROFLOXACIN IN D5W 400 MG/200ML IV SOLN
400.0000 mg | INTRAVENOUS | Status: AC
  Administered 2017-01-17: 400 mg via INTRAVENOUS

## 2017-01-17 ENCOUNTER — Ambulatory Visit: Payer: Medicare Other | Admitting: Anesthesiology

## 2017-01-17 ENCOUNTER — Encounter
Admission: RE | Disposition: A | Discharge status: Home / Self Care | Payer: Self-pay | Source: Ambulatory Visit | Attending: Urology

## 2017-01-17 ENCOUNTER — Ambulatory Visit
Admission: RE | Admit: 2017-01-17 | Discharge: 2017-01-17 | Disposition: A | Discharge status: Home / Self Care | Payer: Medicare Other | Source: Ambulatory Visit | Attending: Urology | Admitting: Urology

## 2017-01-17 DIAGNOSIS — K5730 Diverticulosis of large intestine without perforation or abscess without bleeding: Secondary | ICD-10-CM | POA: Diagnosis not present

## 2017-01-17 DIAGNOSIS — K7460 Unspecified cirrhosis of liver: Secondary | ICD-10-CM | POA: Diagnosis not present

## 2017-01-17 DIAGNOSIS — E039 Hypothyroidism, unspecified: Secondary | ICD-10-CM | POA: Diagnosis not present

## 2017-01-17 DIAGNOSIS — Z950 Presence of cardiac pacemaker: Secondary | ICD-10-CM | POA: Diagnosis not present

## 2017-01-17 DIAGNOSIS — I110 Hypertensive heart disease with heart failure: Secondary | ICD-10-CM | POA: Diagnosis not present

## 2017-01-17 DIAGNOSIS — N132 Hydronephrosis with renal and ureteral calculous obstruction: Secondary | ICD-10-CM | POA: Insufficient documentation

## 2017-01-17 DIAGNOSIS — E1140 Type 2 diabetes mellitus with diabetic neuropathy, unspecified: Secondary | ICD-10-CM | POA: Insufficient documentation

## 2017-01-17 DIAGNOSIS — Z87442 Personal history of urinary calculi: Secondary | ICD-10-CM | POA: Diagnosis not present

## 2017-01-17 DIAGNOSIS — E785 Hyperlipidemia, unspecified: Secondary | ICD-10-CM | POA: Diagnosis not present

## 2017-01-17 DIAGNOSIS — Z7901 Long term (current) use of anticoagulants: Secondary | ICD-10-CM | POA: Insufficient documentation

## 2017-01-17 DIAGNOSIS — F329 Major depressive disorder, single episode, unspecified: Secondary | ICD-10-CM | POA: Insufficient documentation

## 2017-01-17 DIAGNOSIS — K766 Portal hypertension: Secondary | ICD-10-CM | POA: Diagnosis not present

## 2017-01-17 DIAGNOSIS — I509 Heart failure, unspecified: Secondary | ICD-10-CM | POA: Diagnosis not present

## 2017-01-17 DIAGNOSIS — Z880 Allergy status to penicillin: Secondary | ICD-10-CM | POA: Diagnosis not present

## 2017-01-17 DIAGNOSIS — Z952 Presence of prosthetic heart valve: Secondary | ICD-10-CM | POA: Insufficient documentation

## 2017-01-17 DIAGNOSIS — M4856XS Collapsed vertebra, not elsewhere classified, lumbar region, sequela of fracture: Secondary | ICD-10-CM | POA: Insufficient documentation

## 2017-01-17 DIAGNOSIS — N201 Calculus of ureter: Secondary | ICD-10-CM

## 2017-01-17 HISTORY — PX: URETEROSCOPY: SHX842

## 2017-01-17 HISTORY — PX: CYSTOSCOPY W/ URETERAL STENT PLACEMENT: SHX1429

## 2017-01-17 HISTORY — PX: CYSTOSCOPY W/ RETROGRADES: SHX1426

## 2017-01-17 LAB — GLUCOSE, CAPILLARY
GLUCOSE-CAPILLARY: 151 mg/dL — AB (ref 65–99)
Glucose-Capillary: 159 mg/dL — ABNORMAL HIGH (ref 65–99)

## 2017-01-17 SURGERY — URETEROSCOPY
Anesthesia: General | Site: Ureter | Laterality: Left | Wound class: Clean Contaminated

## 2017-01-17 MED ORDER — OXYCODONE HCL 5 MG/5ML PO SOLN: 5.0000 mg | Freq: Once | ORAL | Status: DC | PRN

## 2017-01-17 MED ORDER — PROMETHAZINE HCL 25 MG/ML IJ SOLN: 6.2500 mg | INTRAMUSCULAR | Status: DC | PRN

## 2017-01-17 MED ORDER — ONDANSETRON HCL 4 MG/2ML IJ SOLN
INTRAMUSCULAR | Status: AC
  Filled 2017-01-17: qty 2

## 2017-01-17 MED ORDER — FENTANYL CITRATE (PF) 100 MCG/2ML IJ SOLN: 25.0000 ug | INTRAMUSCULAR | Status: DC | PRN

## 2017-01-17 MED ORDER — ONDANSETRON HCL 4 MG/2ML IJ SOLN
INTRAMUSCULAR | Status: DC | PRN
  Administered 2017-01-17: 4 mg via INTRAVENOUS

## 2017-01-17 MED ORDER — MEPERIDINE HCL 50 MG/ML IJ SOLN: 6.2500 mg | INTRAMUSCULAR | Status: DC | PRN

## 2017-01-17 MED ORDER — OXYCODONE HCL 5 MG PO TABS: 5.0000 mg | ORAL_TABLET | Freq: Once | ORAL | Status: DC | PRN

## 2017-01-17 MED ORDER — FENTANYL CITRATE (PF) 100 MCG/2ML IJ SOLN
INTRAMUSCULAR | Status: AC
  Filled 2017-01-17: qty 2

## 2017-01-17 MED ORDER — PROPOFOL 10 MG/ML IV BOLUS
INTRAVENOUS | Status: DC | PRN
  Administered 2017-01-17: 30 mg via INTRAVENOUS
  Administered 2017-01-17: 20 mg via INTRAVENOUS
  Administered 2017-01-17: 120 mg via INTRAVENOUS

## 2017-01-17 MED ORDER — SODIUM CHLORIDE 0.9 % IV SOLN
INTRAVENOUS | Status: DC
  Administered 2017-01-17: 08:00:00 via INTRAVENOUS

## 2017-01-17 MED ORDER — LIDOCAINE HCL (CARDIAC) 20 MG/ML IV SOLN
INTRAVENOUS | Status: DC | PRN
  Administered 2017-01-17: 60 mg via INTRAVENOUS

## 2017-01-17 MED ORDER — FENTANYL CITRATE (PF) 100 MCG/2ML IJ SOLN
INTRAMUSCULAR | Status: DC | PRN
  Administered 2017-01-17 (×4): 25 ug via INTRAVENOUS

## 2017-01-17 MED ORDER — DEXAMETHASONE SODIUM PHOSPHATE 10 MG/ML IJ SOLN
INTRAMUSCULAR | Status: AC
  Filled 2017-01-17: qty 1

## 2017-01-17 MED ORDER — PHENYLEPHRINE HCL 10 MG/ML IJ SOLN
INTRAMUSCULAR | Status: DC | PRN
  Administered 2017-01-17 (×2): 100 ug via INTRAVENOUS

## 2017-01-17 MED ORDER — PROPOFOL 10 MG/ML IV BOLUS
INTRAVENOUS | Status: AC
  Filled 2017-01-17: qty 20

## 2017-01-17 MED ORDER — CIPROFLOXACIN IN D5W 400 MG/200ML IV SOLN
INTRAVENOUS | Status: AC
  Filled 2017-01-17: qty 200

## 2017-01-17 MED ORDER — LIDOCAINE HCL (PF) 2 % IJ SOLN
INTRAMUSCULAR | Status: AC
  Filled 2017-01-17: qty 4

## 2017-01-17 MED ORDER — FAMOTIDINE 20 MG PO TABS
20.0000 mg | ORAL_TABLET | Freq: Once | ORAL | Status: AC
  Administered 2017-01-17: 20 mg via ORAL

## 2017-01-17 MED ORDER — FAMOTIDINE 20 MG PO TABS
ORAL_TABLET | ORAL | Status: AC
  Administered 2017-01-17: 20 mg via ORAL
  Filled 2017-01-17: qty 1

## 2017-01-17 MED ORDER — IOTHALAMATE MEGLUMINE 43 % IV SOLN
INTRAVENOUS | Status: DC | PRN
  Administered 2017-01-17: 15 mL via URETHRAL

## 2017-01-17 MED ORDER — DEXAMETHASONE SODIUM PHOSPHATE 10 MG/ML IJ SOLN
INTRAMUSCULAR | Status: DC | PRN
  Administered 2017-01-17: 5 mg via INTRAVENOUS

## 2017-01-17 SURGICAL SUPPLY — 29 items
BAG DRAIN CYSTO-URO LG1000N (MISCELLANEOUS) ×4 IMPLANT
BASKET ZERO TIP 1.9FR (BASKET) ×4 IMPLANT
BRUSH SCRUB EZ 1% IODOPHOR (MISCELLANEOUS) ×4 IMPLANT
CATH URETL 5X70 OPEN END (CATHETERS) ×4 IMPLANT
CNTNR SPEC 2.5X3XGRAD LEK (MISCELLANEOUS)
CONRAY 43 FOR UROLOGY 50M (MISCELLANEOUS) ×4 IMPLANT
CONT SPEC 4OZ STER OR WHT (MISCELLANEOUS)
CONTAINER SPEC 2.5X3XGRAD LEK (MISCELLANEOUS) IMPLANT
DRAPE UTILITY 15X26 TOWEL STRL (DRAPES) ×4 IMPLANT
GLOVE BIO SURGEON STRL SZ 6.5 (GLOVE) ×6 IMPLANT
GLOVE BIO SURGEONS STRL SZ 6.5 (GLOVE) ×2
GOWN STRL REUS W/ TWL LRG LVL3 (GOWN DISPOSABLE) ×4 IMPLANT
GOWN STRL REUS W/TWL LRG LVL3 (GOWN DISPOSABLE) ×4
GUIDEWIRE GREEN .038 145CM (MISCELLANEOUS) ×4 IMPLANT
INFUSOR MANOMETER BAG 3000ML (MISCELLANEOUS) ×4 IMPLANT
INTRODUCER DILATOR DOUBLE (INTRODUCER) IMPLANT
KIT RM TURNOVER CYSTO AR (KITS) ×4 IMPLANT
PACK CYSTO AR (MISCELLANEOUS) ×4 IMPLANT
SENSORWIRE 0.038 NOT ANGLED (WIRE) ×4
SET CYSTO W/LG BORE CLAMP LF (SET/KITS/TRAYS/PACK) ×4 IMPLANT
SHEATH URETERAL 12FRX35CM (MISCELLANEOUS) IMPLANT
SOL .9 NS 3000ML IRR  AL (IV SOLUTION) ×2
SOL .9 NS 3000ML IRR UROMATIC (IV SOLUTION) ×2 IMPLANT
STENT URET 6FRX24 CONTOUR (STENTS) ×4 IMPLANT
STENT URET 6FRX26 CONTOUR (STENTS) IMPLANT
SURGILUBE 2OZ TUBE FLIPTOP (MISCELLANEOUS) ×4 IMPLANT
SYRINGE IRR TOOMEY STRL 70CC (SYRINGE) ×4 IMPLANT
WATER STERILE IRR 1000ML POUR (IV SOLUTION) ×4 IMPLANT
WIRE SENSOR 0.038 NOT ANGLED (WIRE) ×2 IMPLANT

## 2017-01-17 NOTE — Interval H&P Note (Signed)
History and Physical Interval Note:  01/17/2017 8:41 AM  Jodi Waters  has presented today for surgery, with the diagnosis of Left ureteral stone  The various methods of treatment have been discussed with the patient and family. After consideration of risks, benefits and other options for treatment, the patient has consented to  Procedure(s): CYSTOSCOPY/URETEROSCOPY/HOLMIUM LASER/STENT EXCHANGE (Left) as a surgical intervention .  The patient's history has been reviewed, patient examined, no change in status, stable for surgery.  I have reviewed the patient's chart and labs.  Questions were answered to the patient's satisfaction.    RRR CTAB   Hollice Espy

## 2017-01-17 NOTE — Op Note (Signed)
Date of procedure: 01/17/17  Preoperative diagnosis:  1. Left ureteral stone 2. Left high grade ureteral obstruction secondary to impacted stone   Postoperative diagnosis:  1. Left ureteral edema, no stone identified   Procedure: 1. Left ureteroscopy 2. Left ureteral stent exchange 3. Left retrograde pyelogram  Surgeon: Hollice Espy, MD  Anesthesia: General  Complications: None  Intraoperative findings: Severe ureteral edema at the level of previously impacted stone with ongoing but improved hydronephrosis without contrast extravasation on retrograde pyelogram. Extremely careful care was taken to inspect the entire ureter and no stone intraluminal stone was identified.  EBL: minimal   Specimens: none  Drains: 6 x 24 French double-J ureteral stent on left  Indication: Jodi Waters is a 82 y.o. patient with severely impacted left distal ureteral calculus who returns today for staged procedure.  After reviewing the management options for treatment, she elected to proceed with the above surgical procedure(s). We have discussed the potential benefits and risks of the procedure, side effects of the proposed treatment, the likelihood of the patient achieving the goals of the procedure, and any potential problems that might occur during the procedure or recuperation. Informed consent has been obtained.  Description of procedure:  The patient was taken to the operating room and general anesthesia was induced.  The patient was placed in the dorsal lithotomy position, prepped and draped in the usual sterile fashion, and preoperative antibiotics were administered. A preoperative time-out was performed.   A 21 French scope was advanced per urethra into the bladder. A small clot was seen adherent to the coil of the left distal ureteral stent which had been previously placed. The distal coil of the stent was grasped using stent graspers and brought to level of the urethral meatus. The stent was  then cannulated using a sensor wire up to level of the kidney without difficulty. The wire was snapped in place as a safety wire while the stent was removed. 4.5 semirigid ureteroscope was then advanced up to level of the distal ureter were severe edema was appreciated. Prior to manipulating this area, didn't perform a retrograde pyelogram through the ureteroscope which showed a small filling defect in this area and mild proximal ureterectasis with ongoing caliectasis but improvement in hydronephrosis. I then advanced the scope beyond the edema with very careful inspection of the wall of the ureter. No obvious stone or stone fragments were identified. Beyond the area of the previously impacted stone, the ureter was normal in contour. Next, a Super Stiff wire was advanced through similar steps level of the kidney. The semirigid scope was then exchanged for a flexible 7 French flexible ureteroscope was advanced over the Super Stiff wire up to level of the renal pelvis without difficulty. The scope was then very carefully and slowly withdrawn down the length of the ureter carefully inspecting along the way. Again, only significant bullous edema as well as some friable mucosa was identified but no evidence of stone or stone fragment. The scope is passed several times to ensure that the stone was not master hidden by the edema. At this point time, it appeared that either the stone had been adequately fragmented during the previous procedure and passed, or alternatively had extravasated through the wall of the ureter with subsequent ureteral healing. Of note, no calcification could be seen on scout imaging today. Finally, the scope was removed. A 6 x 24 French double-J ureteral stent was advanced up to level of the renal pelvis. The wire was partially drawn  until full coil was noted within the renal pelvis. The wire was then fully withdrawn and a full coil within the bladder. The scope was then reintroduced and a small  clot was evacuated from the bladder. There is no active bleeding noted. The stent was seen in excellent position. The bladder was then drained. The patient was then cleaned and dried, repositioned in supine position, reversed from anesthesia, and taken to the PACU in stable condition.  Plan: Patient will return next week to the office for cystoscopy, stent removal.  She'll need follow-up imaging either in the form of noncontrast CT scan 6 weeks after stent pole or ultrasound to assess for improvement of her hydronephrosis. I do suspect her hydro-result more slowly given the severity of the hydronephrosis and presumed duration of obstruction.  Hollice Espy, M.D.

## 2017-01-17 NOTE — Anesthesia Preprocedure Evaluation (Signed)
Anesthesia Evaluation  Patient identified by MRN, date of birth, ID band Patient awake    Reviewed: Allergy & Precautions, NPO status , Patient's Chart, lab work & pertinent test results  History of Anesthesia Complications Negative for: history of anesthetic complications  Airway Mallampati: II  TM Distance: >3 FB Neck ROM: Full    Dental no notable dental hx.    Pulmonary neg sleep apnea, neg COPD,    breath sounds clear to auscultation- rhonchi (-) wheezing      Cardiovascular hypertension, Pt. on medications (-) CAD, (-) Past MI and (-) Cardiac Stents + dysrhythmias + pacemaker + Valvular Problems/Murmurs (hx of MVR and AVR)  Rhythm:Regular Rate:Normal - Systolic murmurs and - Diastolic murmurs Echo 07/23/38: NORMAL LEFT VENTRICULAR SYSTOLIC FUNCTION WITH MODERATE LVH NORMAL RIGHT VENTRICULAR SYSTOLIC FUNCTION SEVERE VALVULAR REGURGITATION (See above) NO VALVULAR STENOSIS LVH: MODERATE LVH Closest EF: >55% (Estimated) Morphology: MECH PROSTHETIC JWJ:[id number] AoV OZH:YQMV PROSTHETIC MV Tricuspid: SEVERE TR Mitral: MODERATE MR Aortic: TRIVIAL AR   Neuro/Psych  Headaches, PSYCHIATRIC DISORDERS Anxiety Depression    GI/Hepatic negative GI ROS, Neg liver ROS,   Endo/Other  diabetes (diet controlled)Hypothyroidism   Renal/GU negative Renal ROS     Musculoskeletal negative musculoskeletal ROS (+)   Abdominal (+) - obese,   Peds  Hematology negative hematology ROS (+)   Anesthesia Other Findings Past Medical History: No date: Anxiety No date: AVD (aortic valve disease) No date: CHF (congestive heart failure) (HCC) No date: Depression No date: Diabetes mellitus without complication (HCC)     Comment:  diet controlled No date: Dyspnea     Comment:  with exertion No date: Dysrhythmia     Comment:  afib 01/2000: Guillain Barr syndrome ([place]) No date: History of kidney stones No date: Hyperlipidemia No  date: Hypertension No date: Hypothyroidism No date: Migraine No date: Peripheral neuropathy No date: Presence of permanent cardiac pacemaker   Reproductive/Obstetrics                             Anesthesia Physical Anesthesia Plan  ASA: III  Anesthesia Plan: General   Post-op Pain Management:    Induction: Intravenous  PONV Risk Score and Plan: 2 and Ondansetron and Dexamethasone  Airway Management Planned: LMA  Additional Equipment:   Intra-op Plan:   Post-operative Plan:   Informed Consent: I have reviewed the patients History and Physical, chart, labs and discussed the procedure including the risks, benefits and alternatives for the proposed anesthesia with the patient or authorized representative who has indicated his/her understanding and acceptance.   Dental advisory given  Plan Discussed with: CRNA and Anesthesiologist  Anesthesia Plan Comments:         Anesthesia Quick Evaluation

## 2017-01-17 NOTE — Anesthesia Postprocedure Evaluation (Signed)
Anesthesia Post Note  Patient: Jodi Waters  Procedure(s) Performed: URETEROSCOPY (Left Ureter) CYSTOSCOPY WITH STENT REPLACEMENT (Left Ureter) CYSTOSCOPY WITH RETROGRADE PYELOGRAM (Left Ureter)  Patient location during evaluation: PACU Anesthesia Type: General Level of consciousness: awake and alert and oriented Pain management: pain level controlled Vital Signs Assessment: post-procedure vital signs reviewed and stable Respiratory status: spontaneous breathing, nonlabored ventilation and respiratory function stable Cardiovascular status: blood pressure returned to baseline and stable Postop Assessment: no signs of nausea or vomiting Anesthetic complications: no     Last Vitals:  Vitals:   01/17/17 1025 01/17/17 1030  BP: (!) 99/56   Pulse: (!) 59 (!) 59  Resp: 20 19  Temp: 36.4 C   SpO2: 93% 93%    Last Pain:  Vitals:   01/17/17 0944  TempSrc: Temporal                 Khaleef Ruby

## 2017-01-17 NOTE — Anesthesia Procedure Notes (Signed)
Procedure Name: LMA Insertion Date/Time: 01/17/2017 8:58 AM Performed by: Doreen Salvage Pre-anesthesia Checklist: Patient identified, Patient being monitored, Timeout performed, Emergency Drugs available and Suction available Patient Re-evaluated:Patient Re-evaluated prior to induction Oxygen Delivery Method: Circle system utilized Preoxygenation: Pre-oxygenation with 100% oxygen Induction Type: IV induction Ventilation: Mask ventilation without difficulty LMA: LMA inserted LMA Size: 4.0 Tube type: Oral Number of attempts: 1 Placement Confirmation: positive ETCO2 and breath sounds checked- equal and bilateral Tube secured with: Tape Dental Injury: Teeth and Oropharynx as per pre-operative assessment

## 2017-01-17 NOTE — H&P (View-Only) (Signed)
12/21/2016 12:05 PM   Jodi Waters Jan 31, 1935 [id number]  Referring provider: Maryland Pink, MD [address] [facility] [place], [place] [address]  Chief Complaint  Patient presents with  . New Patient (Initial Visit)    Kidney stones referred by Dr. Wallene Huh    HPI: Patient is a 82 year old Caucasian female who is referred by Dr. Wallene Huh for nephrolithiasis with her daughter, Jodi Waters.    She underwent a chest CT for an evaluation for interstitial lung disease on 11/29/2016 which noted left hydronephrosis.  Follow up RUS confirmed the left hydronephrosis.  Follow up contrast CT noted severe left hydroureteronephrosis due to 5 mm distal left ureteral calculus.   She also has PMHx significant for DM, cardiomegaly with pleural effusions, cirrhosis, a 4.1 cm ascending thoracic aortic aneurysm and a compression fracture of indeterminate age of L1 vertebral body.  Current Cr is 1.0.    Today, she states that she has not experienced any pain. She has not had any gross hematuria.  She has not had fevers, chills, nausea or vomiting.  UA today is unremarkable.    She does have a prior history of stones - she spontaneously passed one several years ago.   Stone composition is unknown.       Reviewed referral notes - see above.     PMH: Past Medical History:  Diagnosis Date  . CHF (congestive heart failure) ([place])   . Depression   . Diabetes mellitus without complication ([place])   . Dysrhythmia    afib  . Guillain Barr syndrome ([place])   . Hyperlipidemia   . Hypertension   . Hypothyroidism   . Migraine   . Peripheral neuropathy     Surgical History: Past Surgical History:  Procedure Laterality Date  . ABDOMINAL HYSTERECTOMY    . CARDIAC CATHETERIZATION N/A 06/05/2015   Procedure: Right Heart Cath;  Surgeon: Teodoro Spray, MD;  Location: [facility];  Service: Cardiovascular;  Laterality: N/A;  . CARDIAC SURGERY    . CARDIAC VALVE REPLACEMENT     mitral and aortic valve  . CATARACT EXTRACTION    . INSERT / REPLACE / REMOVE PACEMAKER    . ORIF WRIST FRACTURE Left 10/31/2015   Procedure: OPEN REDUCTION INTERNAL FIXATION (ORIF) WRIST FRACTURE;  Surgeon: Hessie Knows, MD;  Location: [facility];  Service: Orthopedics;  Laterality: Left;  . PACEMAKER INSERTION    . PACEMAKER INSERTION  2012   patient states still has this one 12/21/2016  . TONSILLECTOMY      Home Medications:  Allergies as of 12/21/2016      Reactions   Penicillins    Has patient had a PCN reaction causing immediate rash, facial/tongue/throat swelling, SOB or lightheadedness with hypotension: Yes Has patient had a PCN reaction causing severe rash involving mucus membranes or skin necrosis: No Has patient had a PCN reaction that required hospitalization Yes Has patient had a PCN reaction occurring within the last 10 years: Yes If all of the above answers are "NO", then may proceed with Cephalosporin use.      Medication List       Accurate as of 12/21/16 12:05 PM. Always use your most recent med list.          clindamycin 300 MG capsule Commonly known as:  CLEOCIN Take 1 capsule (300 mg total) by mouth 2 (two) times daily.   digoxin 0.125 MG tablet Commonly known as:  LANOXIN Take 1 mg by  mouth every morning.   doxycycline 100 MG tablet Commonly known as:  VIBRA-TABS Take 1 tablet by mouth 2 (two) times daily.   fluticasone 50 MCG/ACT nasal spray Commonly known as:  FLONASE Place into the nose.   furosemide 40 MG tablet Commonly known as:  LASIX Take 40 mg by mouth 2 (two) times daily.   gabapentin 100 MG capsule Commonly known as:  NEURONTIN Take 200 mg by mouth every evening.   HYDROcodone-acetaminophen 5-325 MG tablet Commonly known as:  NORCO Take 1 tablet by mouth every 6 (six) hours as needed for moderate pain.   levothyroxine 50 MCG tablet Commonly known as:  SYNTHROID, LEVOTHROID Take 50 mcg by mouth daily before breakfast.   losartan  25 MG tablet Commonly known as:  COZAAR Take 25 mg by mouth daily.   PARoxetine 20 MG tablet Commonly known as:  PAXIL Take 20 mg by mouth every evening.   simvastatin 20 MG tablet Commonly known as:  ZOCOR Take 20 mg by mouth every evening.   verapamil 180 MG 24 hr capsule Commonly known as:  VERELAN PM Take 180 mg by mouth daily.   VIRTUSSIN A/C 100-10 MG/5ML syrup Generic drug:  guaiFENesin-codeine Take 10 mLs by mouth every 6 (six) hours as needed.   warfarin 4 MG tablet Commonly known as:  COUMADIN Take 4 mg by mouth daily.   warfarin 1 MG tablet Commonly known as:  COUMADIN Take 0.5 mg by mouth.            Discharge Care Instructions        Start     Ordered   12/21/16 0000  Urinalysis, Complete     12/21/16 1133   12/21/16 0000  CULTURE, URINE COMPREHENSIVE     12/21/16 1133   12/21/16 7591  Basic metabolic panel     63/84/66 1133   12/21/16 0000  CBC with Differential/Platelet     12/21/16 1133      Allergies:  Allergies  Allergen Reactions  . Penicillins     Has patient had a PCN reaction causing immediate rash, facial/tongue/throat swelling, SOB or lightheadedness with hypotension: Yes Has patient had a PCN reaction causing severe rash involving mucus membranes or skin necrosis: No Has patient had a PCN reaction that required hospitalization Yes Has patient had a PCN reaction occurring within the last 10 years: Yes If all of the above answers are "NO", then may proceed with Cephalosporin use.     Family History: Family History  Problem Relation Age of Onset  . Bone cancer Maternal Uncle   . Bone cancer Maternal Grandmother   . Kidney cancer Neg Hx   . Bladder Cancer Neg Hx     Social History:  reports that she has never smoked. She has never used smokeless tobacco. She reports that she does not drink alcohol or use drugs.  ROS: UROLOGY Frequent Urination?: Yes Hard to postpone urination?: Yes Burning/pain with urination?: No Get  up at night to urinate?: Yes Leakage of urine?: Yes Urine stream starts and stops?: No Trouble starting stream?: No Do you have to strain to urinate?: No Blood in urine?: No Urinary tract infection?: No Sexually transmitted disease?: No Injury to kidneys or bladder?: No Painful intercourse?: No Weak stream?: No Currently pregnant?: No Vaginal bleeding?: No Last menstrual period?: n  Gastrointestinal Nausea?: No Vomiting?: No Indigestion/heartburn?: No Diarrhea?: No Constipation?: No  Constitutional Fever: No Night sweats?: No Weight loss?: No Fatigue?: No  Skin Skin rash/lesions?: No  Itching?: No  Eyes Blurred vision?: No Double vision?: No  Ears/Nose/Throat Sore throat?: No Sinus problems?: Yes  Hematologic/Lymphatic Swollen glands?: No Easy bruising?: Yes  Cardiovascular Leg swelling?: Yes Chest pain?: No  Respiratory Cough?: Yes Shortness of breath?: Yes  Endocrine Excessive thirst?: No  Musculoskeletal Back pain?: No Joint pain?: No  Neurological Headaches?: Yes Dizziness?: No  Psychologic Depression?: Yes Anxiety?: No  Physical Exam: BP (!) 159/68   Pulse 97   Ht _0  (1.626 m)   Wt 154 lb 1.6 oz (69.9 kg)   BMI 26.45 kg/m   Constitutional: Well nourished. Alert and oriented, No acute distress. HEENT: [place] AT, moist mucus membranes. Trachea midline, no masses. Cardiovascular: No clubbing, cyanosis, or edema. Respiratory: Normal respiratory effort, no increased work of breathing. GI: Abdomen is soft, non tender, non distended, no abdominal masses. Liver and spleen not palpable.  No hernias appreciated.  Stool sample for occult testing is not indicated.   GU: No CVA tenderness.  No bladder fullness or masses.   Skin: No rashes, bruises or suspicious lesions. Lymph: No cervical or inguinal adenopathy. Neurologic: Grossly intact, no focal deficits, moving all 4 extremities. Psychiatric: Normal mood and affect.  Laboratory  Data: Lab Results  Component Value Date   WBC 5.4 07/05/2016   HGB 11.4 (L) 07/05/2016   HCT 33.4 (L) 07/05/2016   MCV 90.4 07/05/2016   PLT 127 (L) 07/05/2016    Lab Results  Component Value Date   CREATININE 1.06 (H) 07/05/2016    Urinalysis Unremarkable.  See EPIC.   I have reviewed the labs.   Pertinent Imaging: CLINICAL DATA:  Severe left hydronephrosis on ultrasound. Nephrolithiasis.  EXAM: CT ABDOMEN AND PELVIS WITH CONTRAST  TECHNIQUE: Multidetector CT imaging of the abdomen and pelvis was performed using the standard protocol following bolus administration of intravenous contrast.  CONTRAST:  125m ISOVUE-300 IOPAMIDOL (ISOVUE-300) INJECTION 61%  COMPARISON:  Ultrasound 12/01/2016 and chest CT on 11/29/2016  FINDINGS: Lower Chest: Severe cardiomegaly which is stable. Tiny right pleural effusion, significantly decreased in size since recent chest CT. Distention of IVC and hepatic veins, consistent with right heart insufficiency.  Hepatobiliary: Hepatic cirrhosis. No liver masses identified. Gallbladder is unremarkable.  Pancreas:  No mass or inflammatory changes.  Spleen: Within normal limits in size and appearance.  Adrenals/Urinary Tract: No masses identified. A few tiny sub-cm right renal cysts are noted. Two tiny less than 5 mm nonobstructing calculi are seen in the right kidney however there is no evidence of right-sided ureteral calculi or hydronephrosis. Severe left hydroureteronephrosis is seen due to a 5 mm calculus in the distal left ureter. Unremarkable unopacified urinary bladder.  Stomach/Bowel: No evidence of obstruction, inflammatory process or abnormal fluid collections. Sigmoid diverticulosis noted, without evidence of diverticulitis.  Vascular/Lymphatic: No pathologically enlarged lymph nodes. No abdominal aortic aneurysm. Aortic atherosclerosis. Upper abdominal and esophageal varices, and recanalization of  paraumbilical veins, consistent with portal venous hypertension.  Reproductive: Prior hysterectomy noted. Adnexal regions are unremarkable in appearance.  Other:  None.  Musculoskeletal: No suspicious bone lesions identified. L1 vertebral compression fracture of indeterminate age, which is stable since recent exam.  IMPRESSION: Severe left hydroureteronephrosis due to 5 mm distal left ureteral calculus.  Small nonobstructing right renal calculi.  Cirrhosis and findings of portal venous hypertension. No evidence of hepatic neoplasm.  Severe cardiomegaly with signs of right heart insufficiency. Significant decrease in size of right pleural effusion since recent chest CT.  Colonic diverticulosis, without radiographic evidence of diverticulitis.  L1 vertebral body compression fracture of indeterminate age, but stable since recent chest CT.   Electronically Signed   By: Earle Gell M.D.   On: 12/16/2016 09:23 I have independently reviewed the films.    Assessment & Plan:    Patient will be scheduled for a left ureteroscopy with laser lithotripsy with ureteral stent placement for her obstructing 5 mm left ureteral stone.    1. Left ureteral stone  - explained to the patient that AUA Guidelines for patients with uncomplicated ureteral stones ?10 mm should be offered observation, and those with distal stones of similar size should be offered MET with ?-blockers - she is not a candidate for MET therapy as after reviewing the CT images it appears that the obstruction caused by the stone has been long-standing  - if after 4 to 6 weeks observation with or without MET is not successful we would pursue URS or ESWL, if the patient/clinician decide to intervene sooner based on a shared decision making approach, the clinicians should offer definitive stone treatment - she is not a candidate for ESWL due to her anticoagulation therapy for artificial heart valves  - schedule left  ureteroscopy with laser lithotripsy and ureteral stent placement  - explained to the patient how the procedure is performed and the risks involved  - informed patient that they will have a stent placed during the procedure and will remain in place after the procedure for a short time.   - stent may be removed in the office with a cystoscope or patient may be instructed to remove the stent themselves by the string  - described "stent pain" as feelings of needing to urinate/overactive bladder and a warm, tingling sensation to intense pain in the affected flank  - residual stones within the kidney or ureter may be present after the procedure and may need to have these addressed at a different encounter  - injury to the ureter is the most common intra-operative risk, it may result in an open procedure to correct the defect  - infection and bleeding are also risks  - explained the risks of general anesthesia, such as: MI, CVA, paralysis, coma and/or death.  - advised to contact our office or seek treatment in the ED if becomes febrile or pain/ vomiting are difficult control in order to arrange for emergent/urgent intervention   2. Left hydronephrosis  - obtain RUS to ensure the hydronephrosis has resolved once she has recovered from URS/LL/ureteral stent placment   3. History of nephrolithiasis  - will offer 24 hour urine once patient has recovered  4. Cardiomegaly/artificial heart valves/pacemarker  - will need cardiac clearance and Lovenox bridge   No Follow-up on file.  These notes generated with voice recognition software. I apologize for typographical errors.  Zara Council, [facility] [address] [place], [place] [address] [phone]

## 2017-01-17 NOTE — Discharge Instructions (Signed)
You have a ureteral stent in place.  This is a tube that extends from your kidney to your bladder.  This may cause urinary bleeding, burning with urination, and urinary frequency.  Please call our office or present to the ED if you develop fevers >101 or pain which is not able to be controlled with oral pain medications.  You may be given either Flomax and/ or ditropan to help with bladder spasms and stent pain in addition to pain medications.    You may resume Coumadin this evening as directed by your primary care physician.  [facility] [address] [place], [place] [address] [phone]

## 2017-01-17 NOTE — Transfer of Care (Signed)
Immediate Anesthesia Transfer of Care Note  Patient: [name]  Procedure(s) Performed: Procedure(s): URETEROSCOPY (Left) CYSTOSCOPY WITH STENT REPLACEMENT (Left) CYSTOSCOPY WITH RETROGRADE PYELOGRAM (Left)  Patient Location: PACU  Anesthesia Type:General  Level of Consciousness: sedated  Airway & Oxygen Therapy: Patient Spontanous Breathing and Patient connected to face mask oxygen  Post-op Assessment: Report given to RN and Post -op Vital signs reviewed and stable  Post vital signs: Reviewed and stable  Last Vitals:  Vitals:   01/17/17 0802 01/17/17 0944  BP: (!) 114/49 (!) 103/54  Pulse: 60 62  Resp: 17 19  Temp: (!) 35.5 C 36.4 C  SpO2: 761% 95%    Complications: No apparent anesthesia complications

## 2017-01-17 NOTE — Anesthesia Post-op Follow-up Note (Signed)
Anesthesia QCDR form completed.        

## 2017-01-18 ENCOUNTER — Encounter: Payer: Self-pay | Admitting: Urology

## 2017-01-18 ENCOUNTER — Ambulatory Visit: Payer: Medicare Other | Admitting: Urology

## 2017-02-02 ENCOUNTER — Ambulatory Visit (INDEPENDENT_AMBULATORY_CARE_PROVIDER_SITE_OTHER): Payer: Medicare Other | Admitting: Urology

## 2017-02-02 ENCOUNTER — Encounter: Payer: Self-pay | Admitting: Urology

## 2017-02-02 VITALS — BP 102/60 | HR 84 | Ht 64.0 in | Wt 154.2 lb

## 2017-02-02 DIAGNOSIS — N200 Calculus of kidney: Secondary | ICD-10-CM

## 2017-02-02 DIAGNOSIS — N201 Calculus of ureter: Secondary | ICD-10-CM

## 2017-02-02 LAB — URINALYSIS, COMPLETE
Bilirubin, UA: NEGATIVE
Glucose, UA: NEGATIVE
KETONES UA: NEGATIVE
Nitrite, UA: NEGATIVE
SPEC GRAV UA: 1.015 (ref 1.005–1.030)
Urobilinogen, Ur: 0.2 mg/dL (ref 0.2–1.0)
pH, UA: 6.5 (ref 5.0–7.5)

## 2017-02-02 LAB — MICROSCOPIC EXAMINATION

## 2017-02-02 MED ORDER — LIDOCAINE HCL 2 % EX GEL: 1.0000 "application " | Freq: Once | CUTANEOUS | Status: AC

## 2017-02-02 MED ORDER — CIPROFLOXACIN HCL 500 MG PO TABS: 500.0000 mg | ORAL_TABLET | Freq: Once | ORAL | Status: AC

## 2017-02-02 NOTE — Progress Notes (Signed)
   02/02/17  CC:  Chief Complaint  Patient presents with  . Cysto Stent Removal    HPI: 83 year old female with a severely impacted left ureteral calculus and underwent staged procedure for treatment of the stone.  She returns today for cystoscopy, stent removal.  Blood pressure 102/60, pulse 84, height 5\' 4"  (1.626 m), weight 154 lb 3.2 oz (69.9 kg). NED. A&Ox3.   No respiratory distress   Abd soft, NT, ND Normal external genitalia with patent urethral meatus  Cystoscopy/ Stent removal procedure  Patient identification was confirmed, informed consent was obtained, and patient was prepped using Betadine solution.  Lidocaine jelly was administered per urethral meatus.    Preoperative abx where received prior to procedure.    Procedure: - Flexible cystoscope introduced, without any difficulty.   - Thorough search of the bladder revealed:    normal urethral meatus  Stent seen emanating from left ureteral orifice, grasped with stent graspers, and removed in entirety.     Post-Procedure: - Patient tolerated the procedure well  Assessment/ Plan:  1. Left ureteral stone S/p staged left ureteroscopy Stent removed today Warning symptoms reviewed as well as indications for more urgent or emergent intervention/evaluation Follow-up in 4 weeks with renal ultrasound prior - Urinalysis, Complete - ciprofloxacin (CIPRO) tablet 500 mg; Take 1 tablet (500 mg total) by mouth once. - lidocaine (XYLOCAINE) 2 % jelly 1 application; Place 1 application into the urethra once. - US RENAL; Future  Return in about 4 weeks (around 03/02/2017) for RUS.  Hollice Espy, MD

## 2017-02-28 ENCOUNTER — Ambulatory Visit
Admission: RE | Admit: 2017-02-28 | Discharge: 2017-02-28 | Disposition: A | Discharge status: Home / Self Care | Payer: Medicare Other | Source: Ambulatory Visit | Attending: Urology | Admitting: Urology

## 2017-02-28 DIAGNOSIS — N202 Calculus of kidney with calculus of ureter: Secondary | ICD-10-CM | POA: Insufficient documentation

## 2017-02-28 DIAGNOSIS — N201 Calculus of ureter: Secondary | ICD-10-CM

## 2017-03-04 ENCOUNTER — Ambulatory Visit (INDEPENDENT_AMBULATORY_CARE_PROVIDER_SITE_OTHER): Payer: Medicare Other | Admitting: Urology

## 2017-03-04 ENCOUNTER — Encounter: Payer: Self-pay | Admitting: Urology

## 2017-03-04 VITALS — BP 125/54 | HR 86 | Ht 64.0 in | Wt 151.2 lb

## 2017-03-04 DIAGNOSIS — N200 Calculus of kidney: Secondary | ICD-10-CM

## 2017-03-04 NOTE — Progress Notes (Signed)
03/04/2017 12:37 PM   Jodi Waters 02-28-1935 [id number]  Referring provider: Maryland Pink, MD [address] [facility] [place], [place] [address]  Chief Complaint  Patient presents with  . RUS    1 month    HPI: 82 year old female with incidental left hydronephrosis ultimately found to have a severely impacted left distal ureteral stone status post staged ureteroscopy.  Her most recent procedure was on 01/17/2017.  Her stent was subsequently removed.  She follows up today with renal ultrasound which shows no evidence of hydronephrosis bilaterally.  On previous CT scan from 11/2016, she does have 2-5 mm right nonobstructing calculi.  She denies any flank pain or dysuria.  Overall, she is doing quite well.  She has two stone episodes in the remote past which were passed spontaneously over 20 years ago.  Does not had a stone episodes since that time.  Stone analysis was unable to be obtained intraoperatively.  She reports that she has an affinity for Dr. Malachi Bonds and used to drink up to 44 ounces a day of this beverage.  Since her stone procedures, she is completely eliminated this from her diet.  She tries to drink plenty of water and Crystal light.  PMH: Past Medical History:  Diagnosis Date  . Anxiety   . AVD (aortic valve disease)   . CHF (congestive heart failure) ([place])   . Depression   . Diabetes mellitus without complication (HCC)    diet controlled  . Dyspnea    with exertion  . Dysrhythmia    afib  . Guillain Barr syndrome ([place]) 01/2000  . History of kidney stones   . Hyperlipidemia   . Hypertension   . Hypothyroidism   . Migraine   . Peripheral neuropathy   . Presence of permanent cardiac pacemaker     Surgical History: Past Surgical History:  Procedure Laterality Date  . ABDOMINAL HYSTERECTOMY    . CARDIAC SURGERY    . CARDIAC VALVE REPLACEMENT     mitral and aortic valve  . CATARACT EXTRACTION    . CYSTOSCOPY WITH RETROGRADE PYELOGRAM  Left 01/17/2017   Performed by Hollice Espy, MD at [facility]S  . CYSTOSCOPY WITH STENT REPLACEMENT Left 01/17/2017   Performed by Hollice Espy, MD at [facility] ORS  . CYSTOSCOPY/URETEROSCOPY/HOLMIUM LASER/STENT PLACEMENT Left 01/04/2017   Performed by Hollice Espy, MD at [facility] ORS  . EYE SURGERY Bilateral    Cataract Extraction with IOL  . INSERT / REPLACE / REMOVE PACEMAKER    . OPEN REDUCTION INTERNAL FIXATION (ORIF) WRIST FRACTURE Left 10/31/2015   Performed by Hessie Knows, MD at [facility]  . PACEMAKER INSERTION    . PACEMAKER INSERTION  2012   patient states still has this one 12/21/2016  . Right Heart Cath N/A 06/05/2015   Performed by Teodoro Spray, MD at [facility]  . TONSILLECTOMY    . URETEROSCOPY Left 01/17/2017   Performed by Hollice Espy, MD at [facility] ORS    Home Medications:  Allergies as of 03/04/2017      Reactions   Penicillins Anaphylaxis   Has patient had a PCN reaction causing immediate rash, facial/tongue/throat swelling, SOB or lightheadedness with hypotension: Yes Has patient had a PCN reaction causing severe rash involving mucus membranes or skin necrosis: No Has patient had a PCN reaction that required hospitalization: Yes Has patient had a PCN reaction occurring within the last 10 years: No If all of the above answers are "NO", then  may proceed with Cephalosporin use.      Medication List        Accurate as of 03/04/17 12:37 PM. Always use your most recent med list.          acetaminophen 500 MG tablet Commonly known as:  TYLENOL Take 500 mg by mouth every 6 (six) hours as needed for mild pain or headache.   digoxin 0.125 MG tablet Commonly known as:  LANOXIN Take 0.125 mg by mouth every morning.   enoxaparin 80 MG/0.8ML injection Commonly known as:  LOVENOX Inject 70 mg into the skin every 12 (twelve) hours.   fluticasone 50 MCG/ACT nasal spray Commonly known as:  FLONASE Place 2 sprays into the nose daily.   furosemide 40 MG  tablet Commonly known as:  LASIX Take 40 mg by mouth daily.   gabapentin 100 MG capsule Commonly known as:  NEURONTIN Take 200 mg by mouth at bedtime.   levothyroxine 50 MCG tablet Commonly known as:  SYNTHROID, LEVOTHROID Take 50 mcg by mouth daily before breakfast.   losartan 25 MG tablet Commonly known as:  COZAAR Take 25 mg by mouth daily.   PARoxetine 20 MG tablet Commonly known as:  PAXIL Take 20 mg by mouth at bedtime.   simvastatin 20 MG tablet Commonly known as:  ZOCOR Take 20 mg by mouth at bedtime.   verapamil 180 MG 24 hr capsule Commonly known as:  VERELAN PM Take 180 mg by mouth daily.   warfarin 4 MG tablet Commonly known as:  COUMADIN Take 4 mg by mouth daily at 6 PM. Takes a total of 4.5mg  daily.   warfarin 1 MG tablet Commonly known as:  COUMADIN Take 0.5 mg by mouth daily at 6 PM. Takes a total of 7.5mg  daily.       Allergies:  Allergies  Allergen Reactions  . Penicillins Anaphylaxis    Has patient had a PCN reaction causing immediate rash, facial/tongue/throat swelling, SOB or lightheadedness with hypotension: Yes Has patient had a PCN reaction causing severe rash involving mucus membranes or skin necrosis: No Has patient had a PCN reaction that required hospitalization: Yes Has patient had a PCN reaction occurring within the last 10 years: No If all of the above answers are "NO", then may proceed with Cephalosporin use.     Family History: Family History  Problem Relation Age of Onset  . Bone cancer Maternal Uncle   . Bone cancer Maternal Grandmother   . Kidney cancer Neg Hx   . Bladder Cancer Neg Hx     Social History:  reports that  has never smoked. she has never used smokeless tobacco. She reports that she does not drink alcohol or use drugs.  ROS: 12 point review of systems was performed and is negative other than as per HPI.  Physical Exam: BP (!) 125/54 (BP Location: Right Arm, Patient Position: Sitting, Cuff Size: Normal)    Pulse 86   Ht 5\' 4"  (1.626 m)   Wt 151 lb 3.2 oz (68.6 kg)   BMI 25.95 kg/m   Constitutional:  Alert and oriented, No acute distress.  Pleasant.  Accompanied by daughter. HEENT: [place] AT, moist mucus membranes.  Trachea midline, no masses. Cardiovascular: No clubbing, cyanosis, or edema. Respiratory: Normal respiratory effort, no increased work of breathing..  Skin: No rashes, bruises or suspicious lesions.. Neurologic: Grossly intact, no focal deficits, moving all 4 extremities. Psychiatric: Normal mood and affect.  Laboratory Data: Lab Results  Component Value Date  WBC 4.4 12/21/2016   HGB 12.2 12/21/2016   HCT 38.1 12/21/2016   MCV 88 12/21/2016   PLT 127 (L) 12/21/2016    Lab Results  Component Value Date   CREATININE 1.04 (H) 12/21/2016   Urinalysis Lab Results  Component Value Date   SPECGRAV 1.015 02/02/2017   PHUR 6.5 02/02/2017   COLORU Yellow 02/02/2017   APPEARANCEUR Cloudy (A) 02/02/2017   LEUKOCYTESUR 1+ (A) 02/02/2017   PROTEINUR 1+ (A) 02/02/2017   GLUCOSEU Negative 02/02/2017   KETONESU Negative 02/02/2017   RBCU 2+ (A) 02/02/2017   BILIRUBINUR Negative 02/02/2017   UUROB 0.2 02/02/2017   NITRITE Negative 02/02/2017    Lab Results  Component Value Date   LABMICR See below: 02/02/2017   WBCUA 0-5 02/02/2017   RBCUA 3-10 (A) 02/02/2017   LABEPIT 0-10 02/02/2017   MUCUS Present (A) 02/02/2017   BACTERIA Few (A) 02/02/2017    Pertinent Imaging: Results for orders placed during the hospital encounter of 02/28/17  US RENAL   Narrative CLINICAL DATA:  Left ureteral stone post ureteroscopy.  EXAM: RENAL / URINARY TRACT ULTRASOUND COMPLETE  COMPARISON:  CT 12/16/2016  FINDINGS: Right Kidney:  Length: 11.0 cm. Echogenicity within normal limits. No mass or hydronephrosis visualized. Right upper pole calculus 7 mm  Left Kidney:  Length: 9.7 cm. Echogenicity within normal limits. No mass or hydronephrosis visualized.  Bladder:  Right  ureteral jet normal. Left ureteral jet not visualized possibly due to edema following procedure  IMPRESSION: Right upper pole 7 mm renal calculus.  Negative for hydronephrosis bilaterally  Left ureteral jet not visualized likely due to recent procedure   Electronically Signed   By: Franchot Gallo M.D.   On: 02/28/2017 13:09    Renal ultrasound personally reviewed today  Assessment & Plan:    1. Kidney stones Status post staged left ureteroscopy for severely impacted left distal ureteral stone-no evidence of residual hydronephrosis on follow-up renal ultrasound which is reassuring  We discussed general stone prevention techniques including drinking plenty water with goal of producing 2.5 L urine daily, increased citric acid intake, avoidance of high oxalate containing foods, and decreased salt intake.  Information about dietary recommendations given today.  She does have 2 nonobstructing small right-sided stones, recommend conservative management with observation.  Follow-up in 6 months with KUB - DG Abd 1 View; Future  Hollice Espy, MD  [facility] [address], [place], [place] [id number] [phone]

## 2017-04-15 ENCOUNTER — Other Ambulatory Visit (HOSPITAL_COMMUNITY): Payer: Self-pay | Admitting: Neurology

## 2017-04-15 DIAGNOSIS — R251 Tremor, unspecified: Secondary | ICD-10-CM

## 2017-04-27 ENCOUNTER — Ambulatory Visit: Payer: Medicare Other | Admitting: Physical Therapy

## 2017-04-28 ENCOUNTER — Other Ambulatory Visit: Payer: Self-pay | Admitting: Neurology

## 2017-04-28 DIAGNOSIS — G250 Essential tremor: Secondary | ICD-10-CM

## 2017-05-02 ENCOUNTER — Ambulatory Visit: Payer: Medicare Other | Admitting: Physical Therapy

## 2017-05-03 ENCOUNTER — Ambulatory Visit
Admission: RE | Admit: 2017-05-03 | Discharge: 2017-05-03 | Disposition: A | Discharge status: Home / Self Care | Payer: Medicare Other | Source: Ambulatory Visit | Attending: Neurology | Admitting: Neurology

## 2017-05-03 DIAGNOSIS — G250 Essential tremor: Secondary | ICD-10-CM | POA: Diagnosis present

## 2017-05-03 DIAGNOSIS — Z8673 Personal history of transient ischemic attack (TIA), and cerebral infarction without residual deficits: Secondary | ICD-10-CM | POA: Diagnosis not present

## 2017-05-04 ENCOUNTER — Ambulatory Visit: Payer: Medicare Other | Admitting: Physical Therapy

## 2017-05-09 ENCOUNTER — Ambulatory Visit: Payer: Medicare Other | Admitting: Physical Therapy

## 2017-05-11 ENCOUNTER — Ambulatory Visit: Payer: Medicare Other | Admitting: Physical Therapy

## 2017-05-16 ENCOUNTER — Ambulatory Visit: Payer: Medicare Other | Admitting: Physical Therapy

## 2017-05-18 ENCOUNTER — Ambulatory Visit: Payer: Medicare Other | Admitting: Physical Therapy

## 2017-05-23 ENCOUNTER — Ambulatory Visit: Payer: Medicare Other | Admitting: Physical Therapy

## 2017-05-25 ENCOUNTER — Ambulatory Visit: Payer: Medicare Other | Admitting: Physical Therapy

## 2017-05-30 ENCOUNTER — Ambulatory Visit: Payer: Medicare Other | Admitting: Physical Therapy

## 2017-06-01 ENCOUNTER — Ambulatory Visit: Payer: Medicare Other | Admitting: Physical Therapy

## 2017-06-20 ENCOUNTER — Emergency Department: Payer: Medicare Other

## 2017-06-20 ENCOUNTER — Inpatient Hospital Stay
Admission: EM | Admit: 2017-06-20 | Discharge: 2017-07-18 | DRG: 871 | Disposition: E | Payer: Medicare Other | Attending: Internal Medicine | Admitting: Internal Medicine

## 2017-06-20 ENCOUNTER — Encounter: Payer: Self-pay | Admitting: Emergency Medicine

## 2017-06-20 ENCOUNTER — Other Ambulatory Visit: Payer: Self-pay

## 2017-06-20 DIAGNOSIS — E785 Hyperlipidemia, unspecified: Secondary | ICD-10-CM | POA: Diagnosis present

## 2017-06-20 DIAGNOSIS — F329 Major depressive disorder, single episode, unspecified: Secondary | ICD-10-CM | POA: Diagnosis present

## 2017-06-20 DIAGNOSIS — Z880 Allergy status to penicillin: Secondary | ICD-10-CM

## 2017-06-20 DIAGNOSIS — E1142 Type 2 diabetes mellitus with diabetic polyneuropathy: Secondary | ICD-10-CM | POA: Diagnosis present

## 2017-06-20 DIAGNOSIS — E039 Hypothyroidism, unspecified: Secondary | ICD-10-CM | POA: Diagnosis present

## 2017-06-20 DIAGNOSIS — A4181 Sepsis due to Enterococcus: Principal | ICD-10-CM | POA: Diagnosis present

## 2017-06-20 DIAGNOSIS — Z87442 Personal history of urinary calculi: Secondary | ICD-10-CM

## 2017-06-20 DIAGNOSIS — Z7901 Long term (current) use of anticoagulants: Secondary | ICD-10-CM

## 2017-06-20 DIAGNOSIS — Y831 Surgical operation with implant of artificial internal device as the cause of abnormal reaction of the patient, or of later complication, without mention of misadventure at the time of the procedure: Secondary | ICD-10-CM | POA: Diagnosis present

## 2017-06-20 DIAGNOSIS — I110 Hypertensive heart disease with heart failure: Secondary | ICD-10-CM | POA: Diagnosis present

## 2017-06-20 DIAGNOSIS — I359 Nonrheumatic aortic valve disorder, unspecified: Secondary | ICD-10-CM | POA: Diagnosis present

## 2017-06-20 DIAGNOSIS — I4891 Unspecified atrial fibrillation: Secondary | ICD-10-CM | POA: Diagnosis present

## 2017-06-20 DIAGNOSIS — F419 Anxiety disorder, unspecified: Secondary | ICD-10-CM | POA: Diagnosis present

## 2017-06-20 DIAGNOSIS — E872 Acidosis: Secondary | ICD-10-CM | POA: Diagnosis present

## 2017-06-20 DIAGNOSIS — I330 Acute and subacute infective endocarditis: Secondary | ICD-10-CM | POA: Diagnosis present

## 2017-06-20 DIAGNOSIS — Z961 Presence of intraocular lens: Secondary | ICD-10-CM | POA: Diagnosis present

## 2017-06-20 DIAGNOSIS — N179 Acute kidney failure, unspecified: Secondary | ICD-10-CM | POA: Diagnosis present

## 2017-06-20 DIAGNOSIS — I5023 Acute on chronic systolic (congestive) heart failure: Secondary | ICD-10-CM

## 2017-06-20 DIAGNOSIS — Z9841 Cataract extraction status, right eye: Secondary | ICD-10-CM

## 2017-06-20 DIAGNOSIS — Z9842 Cataract extraction status, left eye: Secondary | ICD-10-CM | POA: Diagnosis not present

## 2017-06-20 DIAGNOSIS — T826XXA Infection and inflammatory reaction due to cardiac valve prosthesis, initial encounter: Secondary | ICD-10-CM | POA: Diagnosis present

## 2017-06-20 DIAGNOSIS — T45515A Adverse effect of anticoagulants, initial encounter: Secondary | ICD-10-CM | POA: Diagnosis present

## 2017-06-20 DIAGNOSIS — Z955 Presence of coronary angioplasty implant and graft: Secondary | ICD-10-CM | POA: Diagnosis not present

## 2017-06-20 DIAGNOSIS — R0902 Hypoxemia: Secondary | ICD-10-CM

## 2017-06-20 DIAGNOSIS — A419 Sepsis, unspecified organism: Secondary | ICD-10-CM

## 2017-06-20 DIAGNOSIS — Z66 Do not resuscitate: Secondary | ICD-10-CM | POA: Diagnosis present

## 2017-06-20 DIAGNOSIS — R0600 Dyspnea, unspecified: Secondary | ICD-10-CM

## 2017-06-20 DIAGNOSIS — Z95810 Presence of automatic (implantable) cardiac defibrillator: Secondary | ICD-10-CM

## 2017-06-20 DIAGNOSIS — J9601 Acute respiratory failure with hypoxia: Secondary | ICD-10-CM | POA: Diagnosis present

## 2017-06-20 DIAGNOSIS — I5043 Acute on chronic combined systolic (congestive) and diastolic (congestive) heart failure: Secondary | ICD-10-CM | POA: Diagnosis present

## 2017-06-20 DIAGNOSIS — R791 Abnormal coagulation profile: Secondary | ICD-10-CM | POA: Diagnosis present

## 2017-06-20 DIAGNOSIS — N390 Urinary tract infection, site not specified: Secondary | ICD-10-CM | POA: Diagnosis present

## 2017-06-20 DIAGNOSIS — Z515 Encounter for palliative care: Secondary | ICD-10-CM | POA: Diagnosis present

## 2017-06-20 DIAGNOSIS — I509 Heart failure, unspecified: Secondary | ICD-10-CM

## 2017-06-20 HISTORY — DX: Endocarditis, valve unspecified: I38

## 2017-06-20 HISTORY — DX: Presence of prosthetic heart valve: Z95.2

## 2017-06-20 LAB — COMPREHENSIVE METABOLIC PANEL
ALBUMIN: 3 g/dL — AB (ref 3.5–5.0)
ALK PHOS: 98 U/L (ref 38–126)
ALT: 13 U/L — ABNORMAL LOW (ref 14–54)
AST: 30 U/L (ref 15–41)
Anion gap: 13 (ref 5–15)
BUN: 24 mg/dL — ABNORMAL HIGH (ref 6–20)
CO2: 20 mmol/L — AB (ref 22–32)
Calcium: 8.6 mg/dL — ABNORMAL LOW (ref 8.9–10.3)
Chloride: 102 mmol/L (ref 101–111)
Creatinine, Ser: 1.24 mg/dL — ABNORMAL HIGH (ref 0.44–1.00)
GFR calc Af Amer: 46 mL/min — ABNORMAL LOW (ref >60)
GFR calc non Af Amer: 40 mL/min — ABNORMAL LOW (ref >60)
GLUCOSE: 181 mg/dL — AB (ref 65–99)
POTASSIUM: 3.9 mmol/L (ref 3.5–5.1)
SODIUM: 135 mmol/L (ref 135–145)
Total Bilirubin: 2.4 mg/dL — ABNORMAL HIGH (ref 0.3–1.2)
Total Protein: 6.8 g/dL (ref 6.5–8.1)

## 2017-06-20 LAB — URINALYSIS, COMPLETE (UACMP) WITH MICROSCOPIC
Bilirubin Urine: NEGATIVE
GLUCOSE, UA: NEGATIVE mg/dL
Ketones, ur: NEGATIVE mg/dL
NITRITE: NEGATIVE
PH: 5 (ref 5.0–8.0)
PROTEIN: NEGATIVE mg/dL
Specific Gravity, Urine: 1.011 (ref 1.005–1.030)

## 2017-06-20 LAB — BLOOD GAS, VENOUS
ACID-BASE DEFICIT: 4.2 mmol/L — AB (ref 0.0–2.0)
Bicarbonate: 21 mmol/L (ref 20.0–28.0)
O2 SAT: 89.6 %
Patient temperature: 37
pCO2, Ven: 38 mmHg — ABNORMAL LOW (ref 44.0–60.0)
pH, Ven: 7.35 (ref 7.250–7.430)
pO2, Ven: 61 mmHg — ABNORMAL HIGH (ref 32.0–45.0)

## 2017-06-20 LAB — CBC WITH DIFFERENTIAL/PLATELET
Basophils Absolute: 0 10*3/uL (ref 0–0.1)
Basophils Relative: 0 %
Eosinophils Absolute: 0 10*3/uL (ref 0–0.7)
Eosinophils Relative: 0 %
HEMATOCRIT: 35.6 % (ref 35.0–47.0)
HEMOGLOBIN: 11.5 g/dL — AB (ref 12.0–16.0)
LYMPHS ABS: 0.4 10*3/uL — AB (ref 1.0–3.6)
LYMPHS PCT: 1 %
MCH: 28 pg (ref 26.0–34.0)
MCHC: 32.3 g/dL (ref 32.0–36.0)
MCV: 86.8 fL (ref 80.0–100.0)
MONOS PCT: 6 %
Monocytes Absolute: 1.5 10*3/uL — ABNORMAL HIGH (ref 0.2–0.9)
NEUTROS ABS: 25.6 10*3/uL — AB (ref 1.4–6.5)
Neutrophils Relative %: 93 %
Platelets: 175 10*3/uL (ref 150–440)
RBC: 4.1 MIL/uL (ref 3.80–5.20)
RDW: 20.6 % — ABNORMAL HIGH (ref 11.5–14.5)
WBC: 27.5 10*3/uL — ABNORMAL HIGH (ref 3.6–11.0)

## 2017-06-20 LAB — LACTIC ACID, PLASMA: Lactic Acid, Venous: 2.9 mmol/L (ref 0.5–1.9)

## 2017-06-20 LAB — BLOOD GAS, ARTERIAL
Acid-base deficit: 5.6 mmol/L — ABNORMAL HIGH (ref 0.0–2.0)
BICARBONATE: 21.1 mmol/L (ref 20.0–28.0)
FIO2: 0.34
O2 Saturation: 89.8 %
PH ART: 7.28 — AB (ref 7.350–7.450)
PO2 ART: 66 mmHg — AB (ref 83.0–108.0)
Patient temperature: 37
pCO2 arterial: 45 mmHg (ref 32.0–48.0)

## 2017-06-20 LAB — INFLUENZA PANEL BY PCR (TYPE A & B)
Influenza A By PCR: NEGATIVE
Influenza B By PCR: NEGATIVE

## 2017-06-20 LAB — PROTIME-INR
INR: 2.18
Prothrombin Time: 24.1 seconds — ABNORMAL HIGH (ref 11.4–15.2)

## 2017-06-20 LAB — TROPONIN I: Troponin I: 0.35 ng/mL (ref <0.03)

## 2017-06-20 MED ORDER — LEVOTHYROXINE SODIUM 25 MCG PO TABS
25.0000 ug | ORAL_TABLET | Freq: Every day | ORAL | Status: DC
  Administered 2017-06-20 – 2017-06-22 (×3): 25 ug via ORAL
  Filled 2017-06-20 (×3): qty 1

## 2017-06-20 MED ORDER — VERAPAMIL HCL ER 180 MG PO TBCR
180.0000 mg | EXTENDED_RELEASE_TABLET | Freq: Every day | ORAL | Status: DC
  Administered 2017-06-20 – 2017-06-21 (×2): 180 mg via ORAL
  Filled 2017-06-20 (×3): qty 1

## 2017-06-20 MED ORDER — FERROUS SULFATE 325 (65 FE) MG PO TABS
325.0000 mg | ORAL_TABLET | Freq: Every day | ORAL | Status: DC
  Administered 2017-06-20 – 2017-06-21 (×2): 325 mg via ORAL
  Filled 2017-06-20 (×2): qty 1

## 2017-06-20 MED ORDER — GABAPENTIN 100 MG PO CAPS
100.0000 mg | ORAL_CAPSULE | Freq: Every day | ORAL | Status: DC
  Administered 2017-06-20 – 2017-06-21 (×2): 100 mg via ORAL
  Filled 2017-06-20 (×2): qty 1

## 2017-06-20 MED ORDER — DOCUSATE SODIUM 100 MG PO CAPS: 100.0000 mg | ORAL_CAPSULE | Freq: Two times a day (BID) | ORAL | Status: DC | PRN

## 2017-06-20 MED ORDER — VANCOMYCIN HCL IN DEXTROSE 1-5 GM/200ML-% IV SOLN
1000.0000 mg | Freq: Once | INTRAVENOUS | Status: AC
  Administered 2017-06-20: 1000 mg via INTRAVENOUS
  Filled 2017-06-20: qty 200

## 2017-06-20 MED ORDER — WARFARIN SODIUM 5 MG PO TABS
5.0000 mg | ORAL_TABLET | Freq: Once | ORAL | Status: AC
  Administered 2017-06-20: 5 mg via ORAL
  Filled 2017-06-20: qty 1

## 2017-06-20 MED ORDER — FUROSEMIDE 10 MG/ML IJ SOLN
40.0000 mg | Freq: Two times a day (BID) | INTRAMUSCULAR | Status: DC
  Administered 2017-06-20: 40 mg via INTRAVENOUS
  Filled 2017-06-20: qty 4

## 2017-06-20 MED ORDER — SODIUM CHLORIDE 0.9 % IV SOLN
1.0000 g | Freq: Once | INTRAVENOUS | Status: AC
  Administered 2017-06-20: 1 g via INTRAVENOUS
  Filled 2017-06-20: qty 1

## 2017-06-20 MED ORDER — DIGOXIN 125 MCG PO TABS
0.1250 mg | ORAL_TABLET | Freq: Every morning | ORAL | Status: DC
  Administered 2017-06-20 – 2017-06-21 (×2): 0.125 mg via ORAL
  Filled 2017-06-20 (×3): qty 1

## 2017-06-20 MED ORDER — WARFARIN - PHARMACIST DOSING INPATIENT
Freq: Every day | Status: DC
  Administered 2017-06-20: 18:00:00

## 2017-06-20 MED ORDER — LEVOFLOXACIN IN D5W 750 MG/150ML IV SOLN
750.0000 mg | Freq: Once | INTRAVENOUS | Status: AC
  Administered 2017-06-20: 750 mg via INTRAVENOUS
  Filled 2017-06-20: qty 150

## 2017-06-20 MED ORDER — LEVOFLOXACIN IN D5W 250 MG/50ML IV SOLN
250.0000 mg | INTRAVENOUS | Status: DC
  Filled 2017-06-20: qty 50

## 2017-06-20 MED ORDER — ACETAMINOPHEN 500 MG PO TABS: 500.0000 mg | ORAL_TABLET | Freq: Four times a day (QID) | ORAL | Status: DC | PRN

## 2017-06-20 MED ORDER — FLUTICASONE PROPIONATE 50 MCG/ACT NA SUSP
2.0000 | Freq: Every day | NASAL | Status: DC | PRN
  Filled 2017-06-20: qty 16

## 2017-06-20 MED ORDER — SIMVASTATIN 20 MG PO TABS
20.0000 mg | ORAL_TABLET | Freq: Every day | ORAL | Status: DC
  Administered 2017-06-20 – 2017-06-21 (×2): 20 mg via ORAL
  Filled 2017-06-20 (×2): qty 1

## 2017-06-20 MED ORDER — AZTREONAM 1 G IJ SOLR: 1.0000 g | Freq: Three times a day (TID) | INTRAMUSCULAR | Status: DC

## 2017-06-20 MED ORDER — PAROXETINE HCL 20 MG PO TABS
20.0000 mg | ORAL_TABLET | Freq: Every day | ORAL | Status: DC
  Administered 2017-06-20 – 2017-06-21 (×2): 20 mg via ORAL
  Filled 2017-06-20 (×2): qty 1

## 2017-06-20 MED ORDER — FUROSEMIDE 10 MG/ML IJ SOLN
40.0000 mg | Freq: Once | INTRAMUSCULAR | Status: AC
  Administered 2017-06-20: 40 mg via INTRAVENOUS
  Filled 2017-06-20: qty 4

## 2017-06-20 MED ORDER — LOSARTAN POTASSIUM 25 MG PO TABS
25.0000 mg | ORAL_TABLET | Freq: Every day | ORAL | Status: DC
  Administered 2017-06-20 – 2017-06-21 (×2): 25 mg via ORAL
  Filled 2017-06-20 (×2): qty 1

## 2017-06-20 NOTE — Progress Notes (Addendum)
Pt took self off bipap; as she is claustrophobic.  Placed patient on 4L [place].  Called [facility].  Dr. Earleen Newport called back.  Patient and family do not want the ICU plan of care and would like comfort care instead.  Will try comfort care with [place], then move to high-flow nasal cannula if need be.

## 2017-06-20 NOTE — ED Notes (Signed)
Attempted to call report, RN unavailable at this time.

## 2017-06-20 NOTE — H&P (Signed)
 [facility] NAME: Jodi Waters    MR#:  [id number]  DATE OF BIRTH:  [birth year]  DATE OF ADMISSION:  07/06/2017  PRIMARY CARE PHYSICIAN: Jodi Pink, MD   REQUESTING/REFERRING PHYSICIAN: Gwyndolyn Waters  CHIEF COMPLAINT:   Chief Complaint  Patient presents with  . Shortness of Breath    HISTORY OF PRESENT ILLNESS: Jodi Waters  is a 83 y.o. female with a known history of anxiety, cardiac valvular disease status post replacement, congestive heart failure, depression, diabetes, A. fib, status post AICD, hyperlipidemia, hypertension, GBS - at some bilateral sickness 4-5 weeks ago from which she recovered but then gradually continued to feel more weak. She had visited her primary care physicians and cardiologists. There done the workup and there was also some concern of having some blood in her stool. She was seen by GI clinic and they were planning to do a colonoscopy but cardiologist did not clear for that currently. She lives with her daughter and son-in-law, walks with a walker, does not have any dementia or memory issues at baseline. For last 2-3 days she has gradual worsening of her respiratory distress to the point that she is not even able to get up and walk now and concerned with this her daughter called EMS and brought her here. In ER she is noted to have congestive heart failure, also had sepsis symptoms with elevated white blood cell count, tachycardia and tachypnea, mild UTI as per urinalysis. Patient denies any fever, chills, sputum production.  PAST MEDICAL HISTORY:   Past Medical History:  Diagnosis Date  . Anxiety   . AVD (aortic valve disease)   . CHF (congestive heart failure) ([place])   . Depression   . Diabetes mellitus without complication (HCC)    diet controlled  . Dyspnea    with exertion  . Dysrhythmia    afib  . Guillain Barr syndrome ([place]) 01/2000  . Heart valve transplant recipient   . History of kidney stones    . Hyperlipidemia   . Hypertension   . Hypothyroidism   . Migraine   . Peripheral neuropathy   . Presence of permanent cardiac pacemaker     PAST SURGICAL HISTORY:  Past Surgical History:  Procedure Laterality Date  . ABDOMINAL HYSTERECTOMY    . CARDIAC CATHETERIZATION N/A 06/05/2015   Procedure: Right Heart Cath;  Surgeon: Teodoro Spray, MD;  Location: [facility];  Service: Cardiovascular;  Laterality: N/A;  . CARDIAC SURGERY    . CARDIAC VALVE REPLACEMENT     mitral and aortic valve  . CATARACT EXTRACTION    . CYSTOSCOPY W/ RETROGRADES Left 01/17/2017   Procedure: CYSTOSCOPY WITH RETROGRADE PYELOGRAM;  Surgeon: Hollice Espy, MD;  Location: [facility];  Service: Urology;  Laterality: Left;  . CYSTOSCOPY W/ URETERAL STENT PLACEMENT Left 01/17/2017   Procedure: CYSTOSCOPY WITH STENT REPLACEMENT;  Surgeon: Hollice Espy, MD;  Location: [facility];  Service: Urology;  Laterality: Left;  . CYSTOSCOPY/URETEROSCOPY/HOLMIUM LASER/STENT PLACEMENT Left 01/04/2017   Procedure: CYSTOSCOPY/URETEROSCOPY/HOLMIUM LASER/STENT PLACEMENT;  Surgeon: Hollice Espy, MD;  Location: [facility];  Service: Urology;  Laterality: Left;  . EYE SURGERY Bilateral    Cataract Extraction with IOL  . INSERT / REPLACE / REMOVE PACEMAKER    . ORIF WRIST FRACTURE Left 10/31/2015   Procedure: OPEN REDUCTION INTERNAL FIXATION (ORIF) WRIST FRACTURE;  Surgeon: Hessie Knows, MD;  Location: [facility];  Service: Orthopedics;  Laterality: Left;  . PACEMAKER INSERTION    .  PACEMAKER INSERTION  2012   patient states still has this one 12/21/2016  . TONSILLECTOMY    . URETEROSCOPY Left 01/17/2017   Procedure: URETEROSCOPY;  Surgeon: Hollice Espy, MD;  Location: [facility];  Service: Urology;  Laterality: Left;    SOCIAL HISTORY:  Social History   Tobacco Use  . Smoking status: Never Smoker  . Smokeless tobacco: Never Used  Substance Use Topics  . Alcohol use: No    FAMILY HISTORY:  Family History  Problem  Relation Age of Onset  . Bone cancer Maternal Uncle   . Bone cancer Maternal Grandmother   . Kidney cancer Neg Hx   . Bladder Cancer Neg Hx     DRUG ALLERGIES:  Allergies  Allergen Reactions  . Penicillins Anaphylaxis    Has patient had a PCN reaction causing immediate rash, facial/tongue/throat swelling, SOB or lightheadedness with hypotension: Yes Has patient had a PCN reaction causing severe rash involving mucus membranes or skin necrosis: No Has patient had a PCN reaction that required hospitalization: Yes Has patient had a PCN reaction occurring within the last 10 years: No If all of the above answers are "NO", then may proceed with Cephalosporin use.     REVIEW OF SYSTEMS:   CONSTITUTIONAL: No fever, positive for fatigue or weakness.  EYES: No blurred or double vision.  EARS, NOSE, AND THROAT: No tinnitus or ear pain.  RESPIRATORY: No cough, she have shortness of breath, no wheezing or hemoptysis.  CARDIOVASCULAR: No chest pain, orthopnea, edema.  GASTROINTESTINAL: No nausea, vomiting, diarrhea or abdominal pain.  GENITOURINARY: No dysuria, hematuria.  ENDOCRINE: No polyuria, nocturia,  HEMATOLOGY: No anemia, easy bruising or bleeding SKIN: No rash or lesion. MUSCULOSKELETAL: No joint pain or arthritis.   NEUROLOGIC: No tingling, numbness, weakness.  PSYCHIATRY: No anxiety or depression.   MEDICATIONS AT HOME:  Prior to Admission medications   Medication Sig Start Date End Date Taking? Authorizing Provider  digoxin (LANOXIN) 0.125 MG tablet Take 0.125 mg by mouth every morning.    Yes [provider]  ferrous sulfate 325 (65 FE) MG tablet Take 325 mg by mouth daily with breakfast.   Yes [provider]  furosemide (LASIX) 40 MG tablet Take 40 mg by mouth 2 (two) times daily.    Yes [provider]  gabapentin (NEURONTIN) 100 MG capsule Take 100 mg by mouth at bedtime.    Yes [provider]  levothyroxine (SYNTHROID, LEVOTHROID) 25  MCG tablet Take 1 tablet by mouth daily. 03/21/17  Yes [provider]  losartan (COZAAR) 25 MG tablet Take 25 mg by mouth daily.   Yes [provider]  PARoxetine (PAXIL) 20 MG tablet Take 20 mg by mouth at bedtime.    Yes [provider]  simvastatin (ZOCOR) 20 MG tablet Take 20 mg by mouth at bedtime.    Yes [provider]  verapamil (VERELAN PM) 180 MG 24 hr capsule Take 180 mg by mouth daily.   Yes [provider]  warfarin (COUMADIN) 1 MG tablet Take 1 mg by mouth daily at 6 PM.  03/22/16 07/02/2017 Yes [provider]  warfarin (COUMADIN) 4 MG tablet Take 4 mg by mouth daily at 6 PM.    Yes [provider]  acetaminophen (TYLENOL) 500 MG tablet Take 500 mg by mouth every 6 (six) hours as needed for mild pain or headache.    [provider]  enoxaparin (LOVENOX) 80 MG/0.8ML injection Inject 70 mg into the skin every 12 (  twelve) hours. 12/30/16 01/10/17  [provider]  fluticasone (FLONASE) 50 MCG/ACT nasal spray Place 2 sprays into the nose daily.  12/02/16 12/02/17  [provider]      PHYSICAL EXAMINATION:   VITAL SIGNS: Blood pressure (!) 117/47, pulse 82, temperature (!) 97.5 F (36.4 C), temperature source Oral, resp. rate (!) 40, height 5\' 3"  (1.6 m), weight 67.1 kg (148 lb), SpO2 98 %.  GENERAL:  83 y.o.-year-old patient lying in the bed with no acute distress.  EYES: Pupils equal, round, reactive to light and accommodation. No scleral icterus. Extraocular muscles intact.  HEENT: Head atraumatic, normocephalic. Oropharynx and nasopharynx clear.  NECK:  Supple, no jugular venous distention. No thyroid enlargement, no tenderness.  LUNGS: Normal breath sounds bilaterally, no wheezing, bilateral crepitation. No use of accessory muscles of respiration.  CARDIOVASCULAR: S1, S2 normal. systolic murmurs, AICD in place. ABDOMEN: Soft, nontender, nondistended. Bowel sounds present. No organomegaly or mass.   EXTREMITIES: No pedal edema, cyanosis, or clubbing.  NEUROLOGIC: Cranial nerves II through XII are intact. Muscle strength 4/5 in all extremities. Sensation intact. Gait not checked.  PSYCHIATRIC: The patient is alert and oriented x 3.  SKIN: No obvious rash, lesion, or ulcer.   LABORATORY PANEL:   CBC Recent Labs  Lab  1103  WBC 27.5*  HGB 11.5*  HCT 35.6  PLT 175  MCV 86.8  MCH 28.0  MCHC 32.3  RDW 20.6*  LYMPHSABS 0.4*  MONOABS 1.5*  EOSABS 0.0  BASOSABS 0.0   ------------------------------------------------------------------------------------------------------------------  Chemistries  Recent Labs  Lab 07/02/2017 1103  NA 135  K 3.9  CL 102  CO2 20*  GLUCOSE 181*  BUN 24*  CREATININE 1.24*  CALCIUM 8.6*  AST 30  ALT 13*  ALKPHOS 98  BILITOT 2.4*   ------------------------------------------------------------------------------------------------------------------ estimated creatinine clearance is 33.3 mL/min (A) (by C-G formula based on SCr of 1.24 mg/dL (H)). ------------------------------------------------------------------------------------------------------------------ No results for input(s): TSH, T4TOTAL, T3FREE, THYROIDAB in the last 72 hours.  Invalid input(s): FREET3   Coagulation profile Recent Labs  Lab 06/30/2017 1103  INR 2.18   ------------------------------------------------------------------------------------------------------------------- No results for input(s): DDIMER in the last 72 hours. -------------------------------------------------------------------------------------------------------------------  Cardiac Enzymes Recent Labs  Lab 07/11/2017 1103  TROPONINI 0.35*   ------------------------------------------------------------------------------------------------------------------ Invalid input(s):  POCBNP  ---------------------------------------------------------------------------------------------------------------  Urinalysis    Component Value Date/Time   COLORURINE YELLOW (A)  1344   APPEARANCEUR HAZY (A) 07/08/2017 1344   APPEARANCEUR Cloudy (A) 02/02/2017 1456   LABSPEC 1.011 06/17/2017 1344   PHURINE 5.0 06/17/2017 1344   GLUCOSEU NEGATIVE 07/17/2017 1344   HGBUR MODERATE (A) 06/28/2017 1344   BILIRUBINUR NEGATIVE 07/03/2017 1344   BILIRUBINUR Negative 02/02/2017 1456   KETONESUR NEGATIVE 07/16/2017 1344   PROTEINUR NEGATIVE 07/04/2017 1344   NITRITE NEGATIVE 07/14/2017 1344   LEUKOCYTESUR SMALL (A) 06/25/2017 1344   LEUKOCYTESUR 1+ (A) 02/02/2017 1456     RADIOLOGY: Dg Chest Port 1 View  Result Date: 07/03/2017 CLINICAL DATA:  Dyspnea, weakness, shortness of breath EXAM: PORTABLE CHEST 1 VIEW COMPARISON:  CT chest 11/29/2016 FINDINGS: Diffuse bilateral interstitial and alveolar airspace opacities. Small bilateral pleural effusions. No pneumothorax. Stable cardiomegaly. Prior aortic valve replacement. Dual lead cardiac pacemaker is present. Thoracic aortic atherosclerosis. No acute osseous abnormality. IMPRESSION: Findings consistent with CHF. Electronically Signed   By: Kathreen Devoid   On: 06/28/2017 11:19    EKG: Orders placed or performed during the hospital encounter of 07/11/2017  . ED EKG 12-Lead  . ED EKG 12-Lead  . EKG  12-Lead  . EKG 12-Lead    IMPRESSION AND PLAN:  * Acute respiratory failure with hypoxia   Acute on chronic diastolic congestive heart failure    IV Lasix, intake and output measurement, echocardiogram, cardiology consult.   * Status post AICD and mechanical heart, A. fib   Continue warfarin and pharmacy to help dosing.    Continue digoxin and verapamil.  * Sepsis   Likely due to UTI   Levaquin per pharmacy dosing .  * Acute renal failure   Slight worsening in renal function than her baseline, we will continue monitoring  with use of IV Lasix.  * Hypertension   Continue Lasix, losartan.  * Hyperlipidemia   Continue Zocor.  * Hypothyroidism   Continue levothyroxine.  * Elevated LFTs.   Currently we will monitor, due to worsening in the CHF that may be possible.   All the records are reviewed and case discussed with ED provider. Management plans discussed with the patient, family and they are in agreement.  CODE STATUS: DNR Code Status History    This patient does not have a recorded code status. Please follow your organizational policy for patients in this situation.    Advance Directive Documentation     Most Recent Value  Type of Advance Directive  Healthcare Power of Attorney, Living will  Pre-existing out of facility DNR order (yellow form or [facility] MOST form)  No data  "MOST" Form in Place?  No data     Discussed with patient's daughter and son-in-law in the room.  TOTAL TIME TAKING CARE OF THIS PATIENT: 55 minutes.    Vaughan Basta M.D on 07/14/2017   Between 7am to 6pm - Pager - [phone]  After 6pm go to [email or web address] - password [email or web address] [facility]  Office  [phone]  CC: Primary care physician; Jodi Pink, MD   Note: This dictation was prepared with Dragon dictation along with smaller phrase technology. Any transcriptional errors that result from this process are unintentional.

## 2017-06-20 NOTE — Consult Note (Signed)
ANTICOAGULATION CONSULT NOTE - Initial Consult  Pharmacy Consult for Warfarin Dosing and Monitoring  Indication: Mitral Valve repair and Aortic Valve repair     Allergies  Allergen Reactions  . Penicillins Anaphylaxis    Has patient had a PCN reaction causing immediate rash, facial/tongue/throat swelling, SOB or lightheadedness with hypotension: Yes Has patient had a PCN reaction causing severe rash involving mucus membranes or skin necrosis: No Has patient had a PCN reaction that required hospitalization: Yes Has patient had a PCN reaction occurring within the last 10 years: No If all of the above answers are "NO", then may proceed with Cephalosporin use.    Patient Measurements: Height: 5\' 3"  (160 cm) Weight: 148 lb (67.1 kg) IBW/kg (Calculated) : 52.4  Vital Signs: Temp: 97.5 F (36.4 C) (03/04 1123) Temp Source: Oral (03/04 1123) BP: 117/47 (03/04 1230) Pulse Rate: 82 (03/04 1230)  Labs: Recent Labs    07/04/2017 1103  HGB 11.5*  HCT 35.6  PLT 175  LABPROT 24.1*  INR 2.18  CREATININE 1.24*  TROPONINI 0.35*    Estimated Creatinine Clearance: 33.3 mL/min (A) (by C-G formula based on SCr of 1.24 mg/dL (H)).   Medical History: Past Medical History:  Diagnosis Date  . Anxiety   . AVD (aortic valve disease)   . CHF (congestive heart failure) ([place])   . Depression   . Diabetes mellitus without complication (HCC)    diet controlled  . Dyspnea    with exertion  . Dysrhythmia    afib  . Guillain Barr syndrome ([place]) 01/2000  . Heart valve transplant recipient   . History of kidney stones   . Hyperlipidemia   . Hypertension   . Hypothyroidism   . Migraine   . Peripheral neuropathy   . Presence of permanent cardiac pacemaker     Assessment: Pharmacy consulted for warfarin dosing and monitoring in patient with PMH of Mitral Valve repair and Aortic Valve repair. Patient reportedly takes warfarin  5mg  daily at home. INR subtherapeutic on admission.   Goal of  Therapy:  INR 2.5-3.5 Monitor platelets by anticoagulation protocol: Yes   Plan:  INR subtherapeutic on admission, however patient is receiving IV antibiotics that can raise INR.  Will order warfarin 5mg  x 1 dose tonight and recheck INR with AM labs.   INR daily while on antibiotics per protocol.   Pernell Dupre, PharmD, BCPS Clinical Pharmacist 06/19/2017 3:45 PM

## 2017-06-20 NOTE — Consult Note (Signed)
 Pharmacy Antibiotic Note  Jodi Waters is a 83 y.o. female admitted on 06/30/2017 with  UTI.  Pharmacy has been consulted for Levofloxacin dosing. Patient does have a reported anaphylaxis reaction to PCNs. Patient received levofloxacin 750mg  IX x 1 dose in ED.   Plan: Start levofloxacin 250mg  every 24 hours.   Height: 5\' 3"  (160 cm) Weight: 148 lb (67.1 kg) IBW/kg (Calculated) : 52.4  Temp (24hrs), Avg:97.5 F (36.4 C), Min:97.5 F (36.4 C), Max:97.5 F (36.4 C)  Recent Labs  Lab 07/06/2017 1103  WBC 27.5*  CREATININE 1.24*  LATICACIDVEN 2.9*    Estimated Creatinine Clearance: 33.3 mL/min (A) (by C-G formula based on SCr of 1.24 mg/dL (H)).    Allergies  Allergen Reactions  . Penicillins Anaphylaxis    Has patient had a PCN reaction causing immediate rash, facial/tongue/throat swelling, SOB or lightheadedness with hypotension: Yes Has patient had a PCN reaction causing severe rash involving mucus membranes or skin necrosis: No Has patient had a PCN reaction that required hospitalization: Yes Has patient had a PCN reaction occurring within the last 10 years: No If all of the above answers are "NO", then may proceed with Cephalosporin use.     Antimicrobials this admission: Vanc/aztreonam >> x 1 dose 3/4 levofloxacin >>   Dose adjustments this admission:  Microbiology results: 3/4  BCx: pending 3/4  UCx: pending  Thank you for allowing pharmacy to be a part of this patient's care.  Pernell Dupre, PharmD, BCPS Clinical Pharmacist  3:53 PM

## 2017-06-20 NOTE — ED Provider Notes (Addendum)
[facility] Emergency Department Provider Note       Time seen: ----------------------------------------- 10:57 AM on 07/02/2017 -----------------------------------------   I have reviewed the triage vital signs and the nursing notes.  HISTORY   Chief Complaint No chief complaint on file.    HPI Jodi Waters is a 83 y.o. female with a history of anxiety, CHF, depression, diabetes, kidney stones, hyperlipidemia and hypertension who presents to the ED for shortness of breath.  Patient arrived from home with shortness of breath for the past several days.  She denies any recent illness, denies fevers, chills, chest pain but does have significant shortness of breath.  She was placed on supplemental oxygen in route by EMS.  Family states patient has given up and does not want any further treatment.  Past Medical History:  Diagnosis Date  . Anxiety   . AVD (aortic valve disease)   . CHF (congestive heart failure) ([place])   . Depression   . Diabetes mellitus without complication (HCC)    diet controlled  . Dyspnea    with exertion  . Dysrhythmia    afib  . Guillain Barr syndrome ([place]) 01/2000  . History of kidney stones   . Hyperlipidemia   . Hypertension   . Hypothyroidism   . Migraine   . Peripheral neuropathy   . Presence of permanent cardiac pacemaker     Patient Active Problem List   Diagnosis Date Noted  . Distal radius fracture 10/31/2015  . Atrial fibrillation ([place]) 06/05/2015  . Controlled type 2 diabetes mellitus without complication ([place]) 88/41/6606  . Guillain-Barre syndrome ([place]) 06/05/2015  . Pulmonary hypertension ([place]) 06/05/2015  . 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 Aortic valve defect 04/30/2015  . Disorder of mitral valve 04/30/2015    Past Surgical History:  Procedure Laterality Date  . ABDOMINAL HYSTERECTOMY    . CARDIAC CATHETERIZATION N/A 06/05/2015   Procedure: Right Heart Cath;  Surgeon: Teodoro Spray, MD;  Location:  [facility];  Service: Cardiovascular;  Laterality: N/A;  . CARDIAC SURGERY    . CARDIAC VALVE REPLACEMENT     mitral and aortic valve  . CATARACT EXTRACTION    . CYSTOSCOPY W/ RETROGRADES Left 01/17/2017   Procedure: CYSTOSCOPY WITH RETROGRADE PYELOGRAM;  Surgeon: Hollice Espy, MD;  Location: [facility];  Service: Urology;  Laterality: Left;  . CYSTOSCOPY W/ URETERAL STENT PLACEMENT Left 01/17/2017   Procedure: CYSTOSCOPY WITH STENT REPLACEMENT;  Surgeon: Hollice Espy, MD;  Location: [facility];  Service: Urology;  Laterality: Left;  . CYSTOSCOPY/URETEROSCOPY/HOLMIUM LASER/STENT PLACEMENT Left 01/04/2017   Procedure: CYSTOSCOPY/URETEROSCOPY/HOLMIUM LASER/STENT PLACEMENT;  Surgeon: Hollice Espy, MD;  Location: [facility];  Service: Urology;  Laterality: Left;  . EYE SURGERY Bilateral    Cataract Extraction with IOL  . INSERT / REPLACE / REMOVE PACEMAKER    . ORIF WRIST FRACTURE Left 10/31/2015   Procedure: OPEN REDUCTION INTERNAL FIXATION (ORIF) WRIST FRACTURE;  Surgeon: Hessie Knows, MD;  Location: [facility];  Service: Orthopedics;  Laterality: Left;  . PACEMAKER INSERTION    . PACEMAKER INSERTION  2012   patient states still has this one 12/21/2016  . TONSILLECTOMY    . URETEROSCOPY Left 01/17/2017   Procedure: URETEROSCOPY;  Surgeon: Hollice Espy, MD;  Location: [facility];  Service: Urology;  Laterality: Left;    Allergies Penicillins  Social History Social History   Tobacco Use  . Smoking status: Never Smoker  . Smokeless tobacco: Never Used  Substance Use Topics  . Alcohol use:  No  . Drug use: No    Review of Systems Constitutional: Negative for fever. Cardiovascular: Negative for chest pain. Respiratory: Positive shortness of breath Gastrointestinal: Negative for abdominal pain, vomiting and diarrhea. Musculoskeletal: Negative for back pain. Skin: Negative for rash. Neurological: Negative for headaches, focal weakness or numbness.  All systems  negative/normal/unremarkable except as stated in the HPI  ____________________________________________   PHYSICAL EXAM:  VITAL SIGNS: ED Triage Vitals  Enc Vitals Group     BP      Pulse      Resp      Temp      Temp src      SpO2      Weight      Height      Head Circumference      Peak Flow      Pain Score      Pain Loc      Pain Edu?      Excl. in [place]?     Constitutional: Alert and oriented.  Moderate distress Eyes: Conjunctivae are normal. Normal extraocular movements. ENT   Head: Normocephalic and atraumatic.   Nose: No congestion/rhinnorhea.   Mouth/Throat: Mucous membranes are moist.   Neck: No stridor. Cardiovascular: Normal rate, regular rhythm.  Mild systolic click is noted Respiratory: Tachypnea with clear breath sounds Gastrointestinal: Soft and nontender. Normal bowel sounds Musculoskeletal: Nontender with normal range of motion in extremities. No lower extremity tenderness nor edema. Neurologic:  Normal speech and language. No gross focal neurologic deficits are appreciated.  Generalized weakness Skin:  Skin is warm, dry and intact. No rash noted. Psychiatric: Mood and affect are normal. Speech and behavior are normal.  ____________________________________________  EKG: Interpreted by me.  Atrial fibrillation with ventricular paced complexes, rate is 88 bpm, but no other abnormalities are noted  ____________________________________________  ED COURSE:  As part of my medical decision making, I reviewed the following data within the [place] History obtained from family if available, nursing notes, old chart and ekg, as well as notes from prior ED visits. Patient presented for shortness of breath and appears critically ill, we will assess with labs and imaging as indicated at this time.   Procedures ____________________________________________   LABS (pertinent positives/negatives)  Labs Reviewed  LACTIC ACID, PLASMA -  Abnormal; Notable for the following components:      Result Value   Lactic Acid, Venous 2.9 (*)    All other components within normal limits  COMPREHENSIVE METABOLIC PANEL - Abnormal; Notable for the following components:   CO2 20 (*)    Glucose, Bld 181 (*)    BUN 24 (*)    Creatinine, Ser 1.24 (*)    Calcium 8.6 (*)    Albumin 3.0 (*)    ALT 13 (*)    Total Bilirubin 2.4 (*)    GFR calc non Af Amer 40 (*)    GFR calc Af Amer 46 (*)    All other components within normal limits  TROPONIN I - Abnormal; Notable for the following components:   Troponin I 0.35 (*)    All other components within normal limits  CBC WITH DIFFERENTIAL/PLATELET - Abnormal; Notable for the following components:   WBC 27.5 (*)    Hemoglobin 11.5 (*)    RDW 20.6 (*)    Neutro Abs 25.6 (*)    Lymphs Abs 0.4 (*)    Monocytes Absolute 1.5 (*)    All other components within normal limits  BLOOD GAS,  VENOUS - Abnormal; Notable for the following components:   pCO2, Ven 38 (*)    pO2, Ven 61.0 (*)    Acid-base deficit 4.2 (*)    All other components within normal limits  PROTIME-INR - Abnormal; Notable for the following components:   Prothrombin Time 24.1 (*)    All other components within normal limits  CULTURE, BLOOD (ROUTINE X 2)  CULTURE, BLOOD (ROUTINE X 2)  URINE CULTURE  INFLUENZA PANEL BY PCR (TYPE A & B)  URINALYSIS, COMPLETE (UACMP) WITH MICROSCOPIC   CRITICAL CARE Performed by: Laurence Aly   Total critical care time: 30 minutes  Critical care time was exclusive of separately billable procedures and treating other patients.  Critical care was necessary to treat or prevent imminent or life-threatening deterioration.  Critical care was time spent personally by me on the following activities: development of treatment plan with patient and/or surrogate as well as nursing, discussions with consultants, evaluation of patient's response to treatment, examination of patient, obtaining  history from patient or surrogate, ordering and performing treatments and interventions, ordering and review of laboratory studies, ordering and review of radiographic studies, pulse oximetry and re-evaluation of patient's condition.  RADIOLOGY Images were viewed by me  Chest x-ray IMPRESSION: Findings consistent with CHF. ____________________________________________  DIFFERENTIAL DIAGNOSIS   Sepsis, dehydration, electrolyte abnormality, pneumonia, pneumothorax, PE  FINAL ASSESSMENT AND PLAN  Dyspnea,  hypoxia, sepsis   Plan: The patient had presented for shortness of breath. Patient's imaging initially was most concerning for CHF.  I ordered IV Lasix for her.  Her lab work however look more like sepsis with a lactic acidosis and market leukocytosis.  Ordered broad-spectrum antibiotics for her.  She may end up requiring fluids and BiPAP if she becomes hypotensive.  Family states as dictated above that she has essentially given up and does not want any further treatment.  I have advised we will give fluids and antibiotics to determine how well she responds.  I will discussed with the hospitalist for admission.   Laurence Aly, MD   Note: This note was generated in part or whole with voice recognition software. Voice recognition is usually quite accurate but there are transcription errors that can and very often do occur. I apologize for any typographical errors that were not detected and corrected.     Earleen Newport, MD 07/10/2017 1346    Earleen Newport, MD 07/14/2017 1352

## 2017-06-20 NOTE — Progress Notes (Signed)
Pt found to be still very SOB after IV lasix, ABG drawn by RT per order.  O2 found on ABG to be in 60s. Prime doc on call notified by this RN (Dr. Estanislado Pandy on call). Orders placed for step down level of care and bi pap. Pt to be transferred, family at bedside and aware.

## 2017-06-20 NOTE — ED Triage Notes (Signed)
Pt in via [facility] from home with complaints of acute onset shortness of breath x one day.  Pt hypoxic on room air per EMS, saturation 84%, pt on 2L nasal cannula upon arrival.  Pt tachypneic, other vitals WDL.  EDP to bedside at time of arrival.

## 2017-06-20 NOTE — Plan of Care (Signed)
 Pt admitted this shift from ED . A&Ox4. VSS . 4L O2 [place] . Family at bedside. Will continue to monitor and report to oncoming RN .  Progressing Education: Knowledge of General Education information will improve 07/14/2017 1757 - Progressing by Aleen Campi, RN Health Behavior/Discharge Planning: Ability to manage health-related needs will improve  1757 - Progressing by Aleen Campi, RN Clinical Measurements: Ability to maintain clinical measurements within normal limits will improve 07/02/2017 1757 - Progressing by Aleen Campi, RN Will remain free from infection 06/19/2017 1757 - Progressing by Aleen Campi, RN Diagnostic test results will improve 07/15/2017 1757 - Progressing by Aleen Campi, RN Respiratory complications will improve 06/19/2017 1757 - Progressing by Aleen Campi, RN Cardiovascular complication will be avoided 06/22/2017 1757 - Progressing by Aleen Campi, RN Activity: Risk for activity intolerance will decrease 07/09/2017 1757 - Progressing by Aleen Campi, RN Nutrition: Adequate nutrition will be maintained 07/13/2017 1757 - Progressing by Aleen Campi, RN Coping: Level of anxiety will decrease 06/18/2017 1757 - Progressing by Aleen Campi, RN Elimination: Will not experience complications related to bowel motility 06/29/2017 1757 - Progressing by Aleen Campi, RN Will not experience complications related to urinary retention  1757 - Progressing by Aleen Campi, RN Pain Managment: General experience of comfort will improve  1757 - Progressing by Aleen Campi, RN Safety: Ability to remain free from injury will improve 06/18/2017 1757 - Progressing by Aleen Campi, RN Skin Integrity: Risk for impaired skin integrity will decrease 07/12/2017 1757 - Progressing by Aleen Campi, RN

## 2017-06-21 ENCOUNTER — Inpatient Hospital Stay: Payer: Medicare Other

## 2017-06-21 ENCOUNTER — Encounter: Payer: Self-pay | Admitting: Internal Medicine

## 2017-06-21 ENCOUNTER — Inpatient Hospital Stay
Admit: 2017-06-21 | Discharge: 2017-06-21 | Disposition: A | Discharge status: Home / Self Care | Payer: Medicare Other | Attending: Internal Medicine | Admitting: Internal Medicine

## 2017-06-21 LAB — BLOOD CULTURE ID PANEL (REFLEXED)
ACINETOBACTER BAUMANNII: NOT DETECTED
CANDIDA ALBICANS: NOT DETECTED
CANDIDA TROPICALIS: NOT DETECTED
Candida glabrata: NOT DETECTED
Candida krusei: NOT DETECTED
Candida parapsilosis: NOT DETECTED
ENTEROBACTERIACEAE SPECIES: NOT DETECTED
Enterobacter cloacae complex: NOT DETECTED
Enterococcus species: DETECTED — AB
Escherichia coli: NOT DETECTED
HAEMOPHILUS INFLUENZAE: NOT DETECTED
Klebsiella oxytoca: NOT DETECTED
Klebsiella pneumoniae: NOT DETECTED
Listeria monocytogenes: NOT DETECTED
NEISSERIA MENINGITIDIS: NOT DETECTED
PSEUDOMONAS AERUGINOSA: NOT DETECTED
Proteus species: NOT DETECTED
STAPHYLOCOCCUS AUREUS BCID: NOT DETECTED
STREPTOCOCCUS PNEUMONIAE: NOT DETECTED
STREPTOCOCCUS PYOGENES: NOT DETECTED
STREPTOCOCCUS SPECIES: NOT DETECTED
Serratia marcescens: NOT DETECTED
Staphylococcus species: NOT DETECTED
Streptococcus agalactiae: NOT DETECTED
Vancomycin resistance: NOT DETECTED

## 2017-06-21 LAB — BASIC METABOLIC PANEL
Anion gap: 11 (ref 5–15)
BUN: 36 mg/dL — ABNORMAL HIGH (ref 6–20)
CALCIUM: 8.2 mg/dL — AB (ref 8.9–10.3)
CO2: 21 mmol/L — ABNORMAL LOW (ref 22–32)
Chloride: 103 mmol/L (ref 101–111)
Creatinine, Ser: 1.8 mg/dL — ABNORMAL HIGH (ref 0.44–1.00)
GFR, EST AFRICAN AMERICAN: 29 mL/min — AB (ref >60)
GFR, EST NON AFRICAN AMERICAN: 25 mL/min — AB (ref >60)
Glucose, Bld: 197 mg/dL — ABNORMAL HIGH (ref 65–99)
Potassium: 4.5 mmol/L (ref 3.5–5.1)
Sodium: 135 mmol/L (ref 135–145)

## 2017-06-21 LAB — CBC
HCT: 31.4 % — ABNORMAL LOW (ref 35.0–47.0)
HEMOGLOBIN: 10.1 g/dL — AB (ref 12.0–16.0)
MCH: 27.9 pg (ref 26.0–34.0)
MCHC: 32.3 g/dL (ref 32.0–36.0)
MCV: 86.3 fL (ref 80.0–100.0)
Platelets: 131 10*3/uL — ABNORMAL LOW (ref 150–440)
RBC: 3.64 MIL/uL — AB (ref 3.80–5.20)
RDW: 20 % — ABNORMAL HIGH (ref 11.5–14.5)
WBC: 16.5 10*3/uL — ABNORMAL HIGH (ref 3.6–11.0)

## 2017-06-21 LAB — PROTIME-INR
INR: 2.68
PROTHROMBIN TIME: 28.3 s — AB (ref 11.4–15.2)

## 2017-06-21 MED ORDER — WARFARIN SODIUM 3 MG PO TABS
3.0000 mg | ORAL_TABLET | Freq: Once | ORAL | Status: AC
  Administered 2017-06-21: 3 mg via ORAL
  Filled 2017-06-21: qty 1

## 2017-06-21 MED ORDER — LEVOFLOXACIN IN D5W 500 MG/100ML IV SOLN
500.0000 mg | INTRAVENOUS | Status: DC
  Filled 2017-06-21: qty 100

## 2017-06-21 MED ORDER — ONDANSETRON HCL 4 MG/2ML IJ SOLN
4.0000 mg | Freq: Four times a day (QID) | INTRAMUSCULAR | Status: DC | PRN
  Administered 2017-06-21: 4 mg via INTRAVENOUS
  Filled 2017-06-21: qty 2

## 2017-06-21 MED ORDER — VANCOMYCIN HCL IN DEXTROSE 1-5 GM/200ML-% IV SOLN: 1000.0000 mg | INTRAVENOUS | Status: DC

## 2017-06-21 MED ORDER — VANCOMYCIN HCL IN DEXTROSE 1-5 GM/200ML-% IV SOLN
1000.0000 mg | INTRAVENOUS | Status: DC
  Administered 2017-06-21: 1000 mg via INTRAVENOUS

## 2017-06-21 MED ORDER — VANCOMYCIN HCL IN DEXTROSE 1-5 GM/200ML-% IV SOLN
1000.0000 mg | Freq: Once | INTRAVENOUS | Status: DC
  Filled 2017-06-21: qty 200

## 2017-06-21 MED ORDER — VANCOMYCIN HCL IN DEXTROSE 750-5 MG/150ML-% IV SOLN: 750.0000 mg | INTRAVENOUS | Status: DC

## 2017-06-21 NOTE — Care Management (Signed)
Admit from home with respiratory failure due to CHF and sepsis due most likely to UTI.  Oxygen is acute and is requiring HFNC.  Palliative consult pending.  Patient is DNR.Marland Kitchen  Lives with daughter son in law.  Uses a walker. Chronic coumadin

## 2017-06-21 NOTE — Progress Notes (Signed)
Inpatient Diabetes Program Recommendations  AACE/ADA: New Consensus Statement on Inpatient Glycemic Control (2015)  Target Ranges:  Prepandial:   less than 140 mg/dL      Peak postprandial:   less than 180 mg/dL (1-2 hours)      Critically ill patients:  140 - 180 mg/dL   Results for CELESTER, MORGAN (MRN [id number]) as of 06/21/2017 10:10  Ref. Range 06/17/2017 11:03 06/21/2017 06:25  Glucose Latest Ref Range: 65 - 99 mg/dL 181 (H) 197 (H)   Review of Glycemic Control  Diabetes history: DM2 Outpatient Diabetes medications: None Current orders for Inpatient glycemic control: None  Inpatient Diabetes Program Recommendations: Correction (SSI): While inpatient, please consider ordering CBGs with Novolog 0-9 units TID and Novolog 0-5 units QHS.  Thanks, Barnie Alderman, RN, MSN, CDE Diabetes Coordinator Inpatient Diabetes Program [phone] (Team Pager from 8am to 5pm)

## 2017-06-21 NOTE — Consult Note (Signed)
ANTICOAGULATION CONSULT NOTE - Initial Consult  Pharmacy Consult for Warfarin Dosing and Monitoring  Indication: Mitral Valve repair and Aortic Valve repair     Allergies  Allergen Reactions  . Penicillins Anaphylaxis    Has patient had a PCN reaction causing immediate rash, facial/tongue/throat swelling, SOB or lightheadedness with hypotension: Yes Has patient had a PCN reaction causing severe rash involving mucus membranes or skin necrosis: No Has patient had a PCN reaction that required hospitalization: Yes Has patient had a PCN reaction occurring within the last 10 years: No If all of the above answers are "NO", then may proceed with Cephalosporin use.    Patient Measurements: Height: 5\' 3"  (160 cm) Weight: 148 lb (67.1 kg) IBW/kg (Calculated) : 52.4  Vital Signs: Temp: 98.1 F (36.7 C) (03/05 0500) Temp Source: Oral (03/05 0500) BP: 131/34 (03/05 0500) Pulse Rate: 66 (03/05 0939)  Labs: Recent Labs    07/03/2017 1103 06/21/17 0625  HGB 11.5* 10.1*  HCT 35.6 31.4*  PLT 175 131*  LABPROT 24.1* 28.3*  INR 2.18 2.68  CREATININE 1.24* 1.80*  TROPONINI 0.35*  --     Estimated Creatinine Clearance: 22.9 mL/min (A) (by C-G formula based on SCr of 1.8 mg/dL (H)).   Medical History: Past Medical History:  Diagnosis Date  . Anxiety   . AVD (aortic valve disease)   . CHF (congestive heart failure) ([place])   . Depression   . Diabetes mellitus without complication (HCC)    diet controlled  . Dyspnea    with exertion  . Dysrhythmia    afib  . Guillain Barr syndrome ([place]) 01/2000  . Heart valve transplant recipient   . History of kidney stones   . Hyperlipidemia   . Hypertension   . Hypothyroidism   . Migraine   . Peripheral neuropathy   . Presence of permanent cardiac pacemaker     Assessment: Pharmacy consulted for warfarin dosing and monitoring in patient with PMH of Mitral Valve repair and Aortic Valve repair. Patient reportedly takes warfarin  5mg  daily  at home. INR subtherapeutic on admission.   Goal of Therapy:  INR 2.5-3.5 Monitor platelets by anticoagulation protocol: Yes   Plan:  INR 2.7 is therapeutic with a significant increase from yesterday, possibly attributed to acute illness and DDI with levofloxacin. Anticipate INR to increase again tomorrow. Will give warfarin 3 mg PO this evening and recheck INR with AM labs tomorrow.  Lenis Noon, PharmD, BCPS Clinical Pharmacist 06/21/2017 12:48 PM

## 2017-06-21 NOTE — Progress Notes (Signed)
[facility] [place] [facility] NAME: Jodi Waters    MR#:  [id number]  DATE OF BIRTH:  May 18, 1934  SUBJECTIVE:  CHIEF COMPLAINT:   Chief Complaint  Patient presents with  . Shortness of Breath   Came with generalized weakness and worsening shortness of breath for last few weeks. She also had a fall 2 weeks ago and has a cast on her left upper extremity. Was extremely short of breath last night and for few minutes started on BiPAP but patient could not tolerate so finally kept on high flow nasal cannula oxygen and is feeling comfortable on that.  REVIEW OF SYSTEMS:  CONSTITUTIONAL: No fever, positive for fatigue or weakness.  EYES: No blurred or double vision.  EARS, NOSE, AND THROAT: No tinnitus or ear pain.  RESPIRATORY: No cough, positive for shortness of breath, no wheezing or hemoptysis.  CARDIOVASCULAR: No chest pain, orthopnea, edema.  GASTROINTESTINAL: No nausea, vomiting, diarrhea or abdominal pain.  GENITOURINARY: No dysuria, hematuria.  ENDOCRINE: No polyuria, nocturia,  HEMATOLOGY: No anemia, easy bruising or bleeding SKIN: No rash or lesion. MUSCULOSKELETAL: No joint pain or arthritis.   NEUROLOGIC: No tingling, numbness, weakness.  PSYCHIATRY: No anxiety or depression.   ROS  DRUG ALLERGIES:   Allergies  Allergen Reactions  . Penicillins Anaphylaxis    Has patient had a PCN reaction causing immediate rash, facial/tongue/throat swelling, SOB or lightheadedness with hypotension: Yes Has patient had a PCN reaction causing severe rash involving mucus membranes or skin necrosis: No Has patient had a PCN reaction that required hospitalization: Yes Has patient had a PCN reaction occurring within the last 10 years: No If all of the above answers are "NO", then may proceed with Cephalosporin use.     VITALS:  Blood pressure (!) 131/34, pulse 66, temperature 98.1 F (36.7 C), temperature source Oral, resp. rate (!) 35, height 5\' 3"   (1.6 m), weight 67.1 kg (148 lb), SpO2 94 %.  PHYSICAL EXAMINATION:   GENERAL:  83 y.o.-year-old patient lying in the bed with no acute distress.  EYES: Pupils equal, round, reactive to light and accommodation. No scleral icterus. Extraocular muscles intact.  HEENT: Head atraumatic, normocephalic. Oropharynx and nasopharynx clear.  NECK:  Supple, no jugular venous distention. No thyroid enlargement, no tenderness.  LUNGS: Normal breath sounds bilaterally, no wheezing, bilateral crepitation. No use of accessory muscles of respiration. On high flow nasal cannula oxygen currently. CARDIOVASCULAR: S1, S2 normal. systolic murmurs, AICD in place. ABDOMEN: Soft, nontender, nondistended. Bowel sounds present. No organomegaly or mass.  EXTREMITIES: No pedal edema, cyanosis, or clubbing. Left upper extremity is in cast. NEUROLOGIC: Cranial nerves II through XII are intact. Muscle strength 4/5 in all extremities. Sensation intact. Gait not checked.  PSYCHIATRIC: The patient is alert and oriented x 3.  SKIN: No obvious rash, lesion, or ulcer.     Physical Exam LABORATORY PANEL:   CBC Recent Labs  Lab 06/21/17 0625  WBC 16.5*  HGB 10.1*  HCT 31.4*  PLT 131*   ------------------------------------------------------------------------------------------------------------------  Chemistries  Recent Labs  Lab 06/19/2017 1103 06/21/17 0625  NA 135 135  K 3.9 4.5  CL 102 103  CO2 20* 21*  GLUCOSE 181* 197*  BUN 24* 36*  CREATININE 1.24* 1.80*  CALCIUM 8.6* 8.2*  AST 30  --   ALT 13*  --   ALKPHOS 98  --   BILITOT 2.4*  --    ------------------------------------------------------------------------------------------------------------------  Cardiac Enzymes Recent Labs  Lab 06/22/2017 1103  TROPONINI 0.35*   ------------------------------------------------------------------------------------------------------------------  RADIOLOGY:  Dg Chest 1 View  Result Date: 06/21/2017 CLINICAL  DATA:  Respiratory failure due to CHF and sepsis from UTI. EXAM: CHEST 1 VIEW COMPARISON:  07/17/2017 FINDINGS: Left-sided cardiac pacemaker unchanged. Lungs are adequately inflated demonstrate mild interval improvement of bilateral perihilar opacification compatible with mild interval improvement of interstitial edema. Small bilateral pleural effusions without significant change. Stable cardiomegaly. Remainder of the exam is unchanged. IMPRESSION: Slight interval improvement of moderate CHF. Electronically Signed   By: Marin Olp M.D.   On: 06/21/2017 08:35   Dg Chest Port 1 View  Result Date: 07/16/2017 CLINICAL DATA:  Dyspnea, weakness, shortness of breath EXAM: PORTABLE CHEST 1 VIEW COMPARISON:  CT chest 11/29/2016 FINDINGS: Diffuse bilateral interstitial and alveolar airspace opacities. Small bilateral pleural effusions. No pneumothorax. Stable cardiomegaly. Prior aortic valve replacement. Dual lead cardiac pacemaker is present. Thoracic aortic atherosclerosis. No acute osseous abnormality. IMPRESSION: Findings consistent with CHF. Electronically Signed   By: Kathreen Devoid   On: 07/05/2017 11:19    ASSESSMENT AND PLAN:   Principal Problem:   Acute on chronic systolic CHF (congestive heart failure) (HCC) Active Problems:   Sepsis (HCC)   CHF (congestive heart failure) (HCC)  * Acute respiratory failure with hypoxia   Acute on chronic diastolic congestive heart failure    IV Lasix, intake and output measurement, echocardiogram, cardiology consult.   Hold Lasix today has patient has worsening in renal function.  * Bacteremia   Patient have a mechanical heart valve, there is a concern for endocarditis.   IV vancomycin for now and pharmacy to adjust the dosing.   Repeat blood cultures sent, infectious disease consult.   * Status post AICD and mechanical heart valve, A. fib   Continue warfarin and pharmacy to help dosing.    Continue digoxin and verapamil.  * Sepsis   Likely due to  UTI and bacteremia   Levaquin per pharmacy dosing.  * Acute renal failure   Slight worsening in renal function than her baseline, we will continue monitoring with use of IV Lasix.   Lasix held as there is worsening in renal function.  * Hypertension   Continue Lasix, losartan.  * Hyperlipidemia   Continue Zocor.  * Hypothyroidism   Continue levothyroxine.  * Elevated LFTs.   Currently we will monitor, due to worsening in the CHF that may be possible.   If it still stays higher than we will get right upper quadrant sonogram.  All the records are reviewed and case discussed with Care Management/Social Workerr. Management plans discussed with the patient, family and they are in agreement.  CODE STATUS: DNR  TOTAL TIME TAKING CARE OF THIS PATIENT: 40 critical care minutes.  Explained patient's daughter and son-in-law in the room about her critical conditions present with bacteremia, possibility of endocarditis, worsening renal function, requiring high flow nasal cannula oxygen. Confirmed her CODE STATUS being DO NOT RESUSCITATE with them.  POSSIBLE D/C IN 2-3 DAYS, DEPENDING ON CLINICAL CONDITION.   Vaughan Basta M.D on 06/21/2017   Between 7am to 6pm - Pager - [phone]  After 6pm go to [email or web address] - password [email or web address] [facility]  Office  [phone]  CC: Primary care physician; [place] Pink, MD  Note: This dictation was prepared with Dragon dictation along with smaller phrase technology. Any transcriptional errors that result from this process are unintentional.

## 2017-06-21 NOTE — Progress Notes (Signed)
NT reported low pressure of 131/34; paged on-call MD to ask to hold pending dose of IV lasix.

## 2017-06-21 NOTE — Evaluation (Signed)
Physical Therapy Evaluation Patient Details Name: Jodi Waters MRN: [id number] DOB: 1934/10/28 Today's Date: 06/21/2017   History of Present Illness  presented to ER secondary to SOB, respiratory distress; admitted with acute respiratory failure with hypoxia related to CHF exacerbation, sepsis related to UTI.  Clinical Impression  Upon evaluation, patient alert and oriented; follows commands, but fatigues easily with bed-level activities.  Generally weak and deconditioned due to acute illness, requiring act assist and frequent rest periods throughout session to complete all functional tasks.  Unsafe/unable to tolerate sitting edge of bed or OOB attempts this date due to fatigue/SOB (though sats maintained 94-95% throughout session on HFNC 40%).  Will continue to assess/progress as medically appropriate. Would benefit from skilled PT to address above deficits and promote optimal return to PLOF; recommend transition to STR upon discharge from acute hospitalization if able to tolerate intensity of intervention and pending establishment of goals of care for patient/family (pending palliative care consult this date).     Follow Up Recommendations SNF    Equipment Recommendations       Recommendations for Other Services       Precautions / Restrictions Precautions Precautions: Fall Restrictions Weight Bearing Restrictions: No      Mobility  Bed Mobility               General bed mobility comments: unsafe/unable to tolerate due to respiratory status  Transfers                 General transfer comment: unsafe/unable to tolerate due to respiratory status  Ambulation/Gait             General Gait Details: unsafe/unable to tolerate due to respiratory status  Stairs            Wheelchair Mobility    Modified Rankin (Stroke Patients Only)       Balance                                             Pertinent Vitals/Pain Pain  Assessment: No/denies pain    Home Living Family/patient expects to be discharged to:: Private residence Living Arrangements: Children Available Help at Discharge: Family Type of Home: House Home Access: Stairs to enter Entrance Stairs-Rails: None Entrance Stairs-Number of Steps: 2 Home Layout: Two level;Able to live on main level with bedroom/bathroom Home Equipment: Shower seat - built in(3WRW) Additional Comments: daughter works from home and can provide 24/7 support as needed    Prior Function Level of Independence: Needs assistance         Comments: Mod indep with ADLs, household distance gait with 3WRW; does endorse fall history, most recent with L olecranon fracture (casted).  No home O2.     Hand Dominance        Extremity/Trunk Assessment   Upper Extremity Assessment Upper Extremity Assessment: Generalized weakness(R UE grossly 3/5, except digits 3-5 contracted in flexed position at PIP, DIPs (due to previous GBS); L UE shoulder grossly 3-/5 (casted in elbow flexion))    Lower Extremity Assessment Lower Extremity Assessment: Generalized weakness(grossly 3/5 throughout; baseline neuropathy bilat knees distally)       Communication   Communication: No difficulties  Cognition Arousal/Alertness: Awake/alert Behavior During Therapy: WFL for tasks assessed/performed Overall Cognitive Status: Within Functional Limits for tasks assessed  General Comments      Exercises Other Exercises Other Exercises: Supine LE therex, 1x10, for muscular strength/endurance: ankle pumps, heel slides, hip abduct/adduct.  Act assist for movement throughout full range.  Requires frequent rest periods with all activities due to SOB (though sats remain >94% throughout)   Assessment/Plan    PT Assessment Patient needs continued PT services  PT Problem List Decreased strength;Decreased range of motion;Decreased balance;Decreased  activity tolerance;Decreased mobility;Decreased coordination;Cardiopulmonary status limiting activity       PT Treatment Interventions DME instruction;Gait training;Stair training;Functional mobility training;Therapeutic activities;Therapeutic exercise;Balance training;Patient/family education    PT Goals (Current goals can be found in the Care Plan section)  Acute Rehab PT Goals Patient Stated Goal: to return home with family PT Goal Formulation: With family Time For Goal Achievement: 07/05/17 Potential to Achieve Goals: Fair Additional Goals Additional Goal #1: Assess and establish goals for OOB activities as medically appropriate.    Frequency Min 2X/week   Barriers to discharge        Co-evaluation               AM-PAC PT "6 Clicks" Daily Activity  Outcome Measure Difficulty turning over in bed (including adjusting bedclothes, sheets and blankets)?: Unable Difficulty moving from lying on back to sitting on the side of the bed? : Unable Difficulty sitting down on and standing up from a chair with arms (e.g., wheelchair, bedside commode, etc,.)?: Unable Help needed moving to and from a bed to chair (including a wheelchair)?: Total Help needed walking in hospital room?: Total Help needed climbing 3-5 steps with a railing? : Total 6 Click Score: 6    End of Session Equipment Utilized During Treatment: Oxygen Activity Tolerance: Patient limited by lethargy Patient left: in bed;with bed alarm set;with family/visitor present Nurse Communication: Mobility status PT Visit Diagnosis: Muscle weakness (generalized) (M62.81);Difficulty in walking, not elsewhere classified (R26.2)    Time: 6063-0160 PT Time Calculation (min) (ACUTE ONLY): 21 min   Charges:   PT Evaluation $PT Eval Moderate Complexity: 1 Mod PT Treatments $Therapeutic Exercise: 8-22 mins   PT G Codes:        Hava Massingale H. Owens Shark, PT, DPT, NCS 06/21/17, 9:55 AM [phone]

## 2017-06-21 NOTE — Progress Notes (Signed)
PHARMACY - PHYSICIAN COMMUNICATION CRITICAL VALUE ALERT - BLOOD CULTURE IDENTIFICATION (BCID)  Jodi Waters is an 83 y.o. female who presented to [facility] on 06/28/2017 with a chief complaint of   Assessment:  4/4 GPC Enterococcus spp vanA/B (-)  (include suspected source if known)  Name of physician (or Provider) Contacted: Marcille Blanco  Current antibiotics: Levaquin  Changes to prescribed antibiotics recommended:  Vancomycin added (PCN allergic "anaphylaxis")  Results for orders placed or performed during the hospital encounter of 07/14/2017  Blood Culture ID Panel (Reflexed) (Collected: 07/05/2017 11:03 AM)  Result Value Ref Range   Enterococcus species DETECTED (A) NOT DETECTED   Vancomycin resistance NOT DETECTED NOT DETECTED   Listeria monocytogenes NOT DETECTED NOT DETECTED   Staphylococcus species NOT DETECTED NOT DETECTED   Staphylococcus aureus NOT DETECTED NOT DETECTED   Streptococcus species NOT DETECTED NOT DETECTED   Streptococcus agalactiae NOT DETECTED NOT DETECTED   Streptococcus pneumoniae NOT DETECTED NOT DETECTED   Streptococcus pyogenes NOT DETECTED NOT DETECTED   Acinetobacter baumannii NOT DETECTED NOT DETECTED   Enterobacteriaceae species NOT DETECTED NOT DETECTED   Enterobacter cloacae complex NOT DETECTED NOT DETECTED   Escherichia coli NOT DETECTED NOT DETECTED   Klebsiella oxytoca NOT DETECTED NOT DETECTED   Klebsiella pneumoniae NOT DETECTED NOT DETECTED   Proteus species NOT DETECTED NOT DETECTED   Serratia marcescens NOT DETECTED NOT DETECTED   Haemophilus influenzae NOT DETECTED NOT DETECTED   Neisseria meningitidis NOT DETECTED NOT DETECTED   Pseudomonas aeruginosa NOT DETECTED NOT DETECTED   Candida albicans NOT DETECTED NOT DETECTED   Candida glabrata NOT DETECTED NOT DETECTED   Candida krusei NOT DETECTED NOT DETECTED   Candida parapsilosis NOT DETECTED NOT DETECTED   Candida tropicalis NOT DETECTED NOT DETECTED    [name] S 06/21/2017   5:56 AM

## 2017-06-21 NOTE — Consult Note (Signed)
 [facility] Infectious Disease     Reason for Consult: Enterococcal bacteremia   Referring Physician: Dolores Frame Date of Admission:  07/06/2017   Principal Problem:   Acute on chronic systolic CHF (congestive heart failure) ([place]) Active Problems:   Sepsis ([place])   CHF (congestive heart failure) ([place])   HPI: Jodi Waters is a 83 y.o. female admitted with 4-5 weeks of progressive weakness, increasing SOB.  She lives with her daughter and son-in-law, walks with a walker, does not have any dementia or memory issues at baseline.  She was seen by PCP 05/23/17 with cough and congestion and fever.  No obvious recent bacteremias or UTIs. CXR with CHF.  Has RF with cr up to 1.8 from 1.0 baseline.  On this admission wbc 27, no fevers. BCX + Enterococcus.  Flu negative. UA 6-30 wbc She has hx of anxiety, cardiac valvular disease status post replacement due to prior rheumatic fever and apparent endocarditis., congestive heart failure, depression, diabetes, A. fib, status post AICD, hyperlipidemia, hypertension,    Past Medical History:  Diagnosis Date  . Anxiety   . AVD (aortic valve disease)   . CHF (congestive heart failure) ([place])   . Depression   . Diabetes mellitus without complication (HCC)    diet controlled  . Dyspnea    with exertion  . Dysrhythmia    afib  . Guillain Barr syndrome ([place]) 01/2000  . Heart valve transplant recipient   . History of kidney stones   . Hyperlipidemia   . Hypertension   . Hypothyroidism   . Migraine   . Peripheral neuropathy   . Presence of permanent cardiac pacemaker    Past Surgical History:  Procedure Laterality Date  . ABDOMINAL HYSTERECTOMY    . CARDIAC CATHETERIZATION N/A 06/05/2015   Procedure: Right Heart Cath;  Surgeon: Teodoro Spray, MD;  Location: [facility];  Service: Cardiovascular;  Laterality: N/A;  . CARDIAC SURGERY    . CARDIAC VALVE REPLACEMENT     mitral and aortic valve  . CATARACT EXTRACTION    . CYSTOSCOPY W/  RETROGRADES Left 01/17/2017   Procedure: CYSTOSCOPY WITH RETROGRADE PYELOGRAM;  Surgeon: Hollice Espy, MD;  Location: [facility];  Service: Urology;  Laterality: Left;  . CYSTOSCOPY W/ URETERAL STENT PLACEMENT Left 01/17/2017   Procedure: CYSTOSCOPY WITH STENT REPLACEMENT;  Surgeon: Hollice Espy, MD;  Location: [facility];  Service: Urology;  Laterality: Left;  . CYSTOSCOPY/URETEROSCOPY/HOLMIUM LASER/STENT PLACEMENT Left 01/04/2017   Procedure: CYSTOSCOPY/URETEROSCOPY/HOLMIUM LASER/STENT PLACEMENT;  Surgeon: Hollice Espy, MD;  Location: [facility];  Service: Urology;  Laterality: Left;  . EYE SURGERY Bilateral    Cataract Extraction with IOL  . INSERT / REPLACE / REMOVE PACEMAKER    . ORIF WRIST FRACTURE Left 10/31/2015   Procedure: OPEN REDUCTION INTERNAL FIXATION (ORIF) WRIST FRACTURE;  Surgeon: Hessie Knows, MD;  Location: [facility];  Service: Orthopedics;  Laterality: Left;  . PACEMAKER INSERTION    . PACEMAKER INSERTION  2012   patient states still has this one 12/21/2016  . TONSILLECTOMY    . URETEROSCOPY Left 01/17/2017   Procedure: URETEROSCOPY;  Surgeon: Hollice Espy, MD;  Location: [facility];  Service: Urology;  Laterality: Left;   Social History   Tobacco Use  . Smoking status: Never Smoker  . Smokeless tobacco: Never Used  Substance Use Topics  . Alcohol use: No  . Drug use: No   Family History  Problem Relation Age of Onset  . Bone cancer Maternal Uncle   .  Bone cancer Maternal Grandmother   . Kidney cancer Neg Hx   . Bladder Cancer Neg Hx     Allergies:  Allergies  Allergen Reactions  . Penicillins Anaphylaxis    Has patient had a PCN reaction causing immediate rash, facial/tongue/throat swelling, SOB or lightheadedness with hypotension: Yes Has patient had a PCN reaction causing severe rash involving mucus membranes or skin necrosis: No Has patient had a PCN reaction that required hospitalization: Yes Has patient had a PCN reaction occurring within the last 10  years: No If all of the above answers are "NO", then may proceed with Cephalosporin use.     Current antibiotics: Antibiotics Given (last 72 hours)    Date/Time Action Medication Dose Rate   06/29/2017 1339 New Bag/Given   aztreonam (AZACTAM) 1 g in sodium chloride 0.9 % 100 mL IVPB 1 g 200 mL/hr   06/19/2017 1352 New Bag/Given   levofloxacin (LEVAQUIN) IVPB 750 mg 750 mg 100 mL/hr   06/30/2017 1510 New Bag/Given   vancomycin (VANCOCIN) IVPB 1000 mg/200 mL premix 1,000 mg 200 mL/hr   06/21/17 0555 New Bag/Given   vancomycin (VANCOCIN) IVPB 1000 mg/200 mL premix 1,000 mg 200 mL/hr      MEDICATIONS: . digoxin  0.125 mg Oral q morning - 10a  . ferrous sulfate  325 mg Oral Q breakfast  . gabapentin  100 mg Oral QHS  . levothyroxine  25 mcg Oral Q breakfast  . losartan  25 mg Oral Daily  . PARoxetine  20 mg Oral QHS  . simvastatin  20 mg Oral QHS  . verapamil  180 mg Oral Daily  . warfarin  3 mg Oral ONCE-1800  . Warfarin - Pharmacist Dosing Inpatient   Does not apply q1800    Review of Systems - 11 systems reviewed and negative per HPI   OBJECTIVE: Temp:  [97.3 F (36.3 C)-98.1 F (36.7 C)] 98.1 F (36.7 C) (03/05 0500) Pulse Rate:  [64-89] 66 (03/05 0939) Resp:  [35-37] 35 (03/04 1630) BP: (108-148)/(34-68) 131/34 (03/05 0500) SpO2:  [94 %-97 %] 94 % (03/05 0925) FiO2 (%):  [40 %] 40 % (03/05 0925) Physical Exam  Constitutional: thin, frail. On hi flo O2 HENT: [place]/AT, PERRLA, no scleral icterus Mouth/Throat: Oropharynx is clear and mdryoist. No oropharyngeal exudate.  Cardiovascular: Normal rate, regular rhythm and normal heart sounds. 2/6 sm Pulmonary/Chest: bil rhonchi Neck = supple, no nuchal rigidity Abdominal: Soft. Bowel sounds are normal.  exhibits no distension. There is no tenderness.  Lymphadenopathy: no cervical adenopathy. No axillary adenopathy Neurological: alert and oriented to person, place, and time.  Skin: Skin is warm and dry. No rash noted. No  erythema.  Ext L arm in cast Bil 2+ L:E edema Psychiatric: a normal mood and affect.  behavior is normal.    LABS: Results for orders placed or performed during the hospital encounter of 06/29/2017 (from the past 48 hour(s))  Lactic acid, plasma     Status: Abnormal   Collection Time: 07/03/2017 11:03 AM  Result Value Ref Range   Lactic Acid, Venous 2.9 (HH) 0.5 - 1.9 mmol/L    Comment: CRITICAL RESULT CALLED TO, READ BACK BY AND VERIFIED WITH ASHLEY SMITH AT 1217 07/16/2017 DAS Performed at [facility], [address] [place], [place] [address]   Comprehensive metabolic panel     Status: Abnormal   Collection Time: 06/28/2017 11:03 AM  Result Value Ref Range   Sodium 135 135 - 145 mmol/L   Potassium 3.9 3.5 -  5.1 mmol/L   Chloride 102 101 - 111 mmol/L   CO2 20 (L) 22 - 32 mmol/L   Glucose, Bld 181 (H) 65 - 99 mg/dL   BUN 24 (H) 6 - 20 mg/dL   Creatinine, Ser 1.24 (H) 0.44 - 1.00 mg/dL   Calcium 8.6 (L) 8.9 - 10.3 mg/dL   Total Protein 6.8 6.5 - 8.1 g/dL   Albumin 3.0 (L) 3.5 - 5.0 g/dL   AST 30 15 - 41 U/L   ALT 13 (L) 14 - 54 U/L   Alkaline Phosphatase 98 38 - 126 U/L   Total Bilirubin 2.4 (H) 0.3 - 1.2 mg/dL   GFR calc non Af Amer 40 (L) >60 mL/min   GFR calc Af Amer 46 (L) >60 mL/min    Comment: (NOTE) The eGFR has been calculated using the CKD EPI equation. This calculation has not been validated in all clinical situations. eGFR's persistently <60 mL/min signify possible Chronic Kidney Disease.    Anion gap 13 5 - 15    Comment: Performed at [facility], [address] [place], [place] [address]  Troponin I     Status: Abnormal   Collection Time: 07/04/2017 11:03 AM  Result Value Ref Range   Troponin I 0.35 (HH) <0.03 ng/mL    Comment: CRITICAL RESULT CALLED TO, READ BACK BY AND VERIFIED WITH ASHLEY SMITH @1232  07/07/2017 AKT Performed at [facility], [address] [place], [place] [address]   CBC WITH DIFFERENTIAL     Status: Abnormal    Collection Time: 06/25/2017 11:03 AM  Result Value Ref Range   WBC 27.5 (H) 3.6 - 11.0 K/uL   RBC 4.10 3.80 - 5.20 MIL/uL   Hemoglobin 11.5 (L) 12.0 - 16.0 g/dL   HCT 35.6 35.0 - 47.0 %   MCV 86.8 80.0 - 100.0 fL   MCH 28.0 26.0 - 34.0 pg   MCHC 32.3 32.0 - 36.0 g/dL   RDW 20.6 (H) 11.5 - 14.5 %   Platelets 175 150 - 440 K/uL   Neutrophils Relative % 93 %   Neutro Abs 25.6 (H) 1.4 - 6.5 K/uL   Lymphocytes Relative 1 %   Lymphs Abs 0.4 (L) 1.0 - 3.6 K/uL   Monocytes Relative 6 %   Monocytes Absolute 1.5 (H) 0.2 - 0.9 K/uL   Eosinophils Relative 0 %   Eosinophils Absolute 0.0 0 - 0.7 K/uL   Basophils Relative 0 %   Basophils Absolute 0.0 0 - 0.1 K/uL    Comment: Performed at [facility], [address] [place], [place] [address]  Blood gas, venous ([place], [facility], [facility])     Status: Abnormal   Collection Time: 07/17/2017 11:03 AM  Result Value Ref Range   pH, Ven 7.35 7.250 - 7.430   pCO2, Ven 38 (L) 44.0 - 60.0 mmHg   pO2, Ven 61.0 (H) 32.0 - 45.0 mmHg   Bicarbonate 21.0 20.0 - 28.0 mmol/L   Acid-base deficit 4.2 (H) 0.0 - 2.0 mmol/L   O2 Saturation 89.6 %   Patient temperature 37.0    Collection site VEIN    Sample type VENOUS     Comment: Performed at [facility], [address] [place], [place] [address]  Blood Culture (routine x 2)     Status: None (Preliminary result)   Collection Time: 07/09/2017 11:03 AM  Result Value Ref Range   Specimen Description      BLOOD RIGHT HAND Performed at [facility],  [address] [place], [place] [address]    Special Requests      BOTTLES DRAWN AEROBIC AND ANAEROBIC Blood Culture adequate volume Performed at [place], [place] [address]    Culture  Setup Time      GRAM POSITIVE COCCI IN BOTH AEROBIC AND ANAEROBIC BOTTLES CRITICAL RESULT CALLED TO, READ BACK BY AND VERIFIED WITH: MATT MCBANE ON 06/21/17 AT 0354 BY JAG Performed at [facility], [place] [address],  [place], [place] [address]    Culture GRAM POSITIVE COCCI    Report Status PENDING   Blood Culture (routine x 2)     Status: None (Preliminary result)   Collection Time: 07/03/2017 11:03 AM  Result Value Ref Range   Specimen Description      BLOOD BLOOD RIGHT FOREARM Performed at [facility], [place] [address] [place], [place] [address]    Special Requests      BOTTLES DRAWN AEROBIC AND ANAEROBIC Blood Culture results may not be optimal due to an excessive volume of blood received in culture bottles   Culture  Setup Time      GRAM POSITIVE COCCI IN BOTH AEROBIC AND ANAEROBIC BOTTLES CRITICAL VALUE NOTED.  VALUE IS CONSISTENT WITH PREVIOUSLY REPORTED AND CALLED VALUE. Performed at [facility], [address] [place], [place] [address]    Culture GRAM POSITIVE COCCI    Report Status PENDING   Influenza panel by PCR (type A & B)     Status: None   Collection Time: 07/12/2017 11:03 AM  Result Value Ref Range   Influenza A By PCR NEGATIVE NEGATIVE   Influenza B By PCR NEGATIVE NEGATIVE    Comment: (NOTE) The Xpert Xpress Flu assay is intended as an aid in the diagnosis of  influenza and should not be used as a sole basis for treatment.  This  assay is FDA approved for nasopharyngeal swab specimens only. Nasal  washings and aspirates are unacceptable for Xpert Xpress Flu testing. Performed at [facility], [address] [place], [place] [address]   Protime-INR     Status: Abnormal   Collection Time: 06/18/2017 11:03 AM  Result Value Ref Range   Prothrombin Time 24.1 (H) 11.4 - 15.2 seconds   INR 2.18     Comment: Performed at [facility], [address] [place], [place] [address]  Blood Culture ID Panel (Reflexed)     Status: Abnormal   Collection Time: 06/18/2017 11:03 AM  Result Value Ref Range   Enterococcus species DETECTED (A) NOT DETECTED    Comment: CRITICAL RESULT CALLED TO, READ BACK BY AND VERIFIED WITH: MATT MCBANE ON 06/21/17 AT 0354 BY  JAG    Vancomycin resistance NOT DETECTED NOT DETECTED   Listeria monocytogenes NOT DETECTED NOT DETECTED   Staphylococcus species NOT DETECTED NOT DETECTED   Staphylococcus aureus NOT DETECTED NOT DETECTED   Streptococcus species NOT DETECTED NOT DETECTED   Streptococcus agalactiae NOT DETECTED NOT DETECTED   Streptococcus pneumoniae NOT DETECTED NOT DETECTED   Streptococcus pyogenes NOT DETECTED NOT DETECTED   Acinetobacter baumannii NOT DETECTED NOT DETECTED   Enterobacteriaceae species NOT DETECTED NOT DETECTED   Enterobacter cloacae complex NOT DETECTED NOT DETECTED   Escherichia coli NOT DETECTED NOT DETECTED   Klebsiella oxytoca NOT DETECTED NOT DETECTED   Klebsiella pneumoniae NOT DETECTED NOT DETECTED   Proteus species NOT DETECTED NOT DETECTED   Serratia marcescens NOT DETECTED NOT DETECTED   Haemophilus  influenzae NOT DETECTED NOT DETECTED   Neisseria meningitidis NOT DETECTED NOT DETECTED   Pseudomonas aeruginosa NOT DETECTED NOT DETECTED   Candida albicans NOT DETECTED NOT DETECTED   Candida glabrata NOT DETECTED NOT DETECTED   Candida krusei NOT DETECTED NOT DETECTED   Candida parapsilosis NOT DETECTED NOT DETECTED   Candida tropicalis NOT DETECTED NOT DETECTED    Comment: Performed at [facility], [address], [place], [place] [address]  Urine culture     Status: None (Preliminary result)   Collection Time: 07/06/2017  1:44 PM  Result Value Ref Range   Specimen Description      URINE, RANDOM Performed at [facility], [address] [place], [place] [address]    Special Requests      NONE Performed at [facility], [address], [place], [place] [address]    Culture      CULTURE REINCUBATED FOR BETTER GROWTH Performed at [facility], [place] [address], [place], [place] [address]    Report Status PENDING   Urinalysis, Complete w Microscopic     Status: Abnormal   Collection Time: 06/22/2017  1:44 PM  Result Value Ref  Range   Color, Urine YELLOW (A) YELLOW   APPearance HAZY (A) CLEAR   Specific Gravity, Urine 1.011 1.005 - 1.030   pH 5.0 5.0 - 8.0   Glucose, UA NEGATIVE NEGATIVE mg/dL   Hgb urine dipstick MODERATE (A) NEGATIVE   Bilirubin Urine NEGATIVE NEGATIVE   Ketones, ur NEGATIVE NEGATIVE mg/dL   Protein, ur NEGATIVE NEGATIVE mg/dL   Nitrite NEGATIVE NEGATIVE   Leukocytes, UA SMALL (A) NEGATIVE   RBC / HPF 0-5 0 - 5 RBC/hpf   WBC, UA 6-30 0 - 5 WBC/hpf   Bacteria, UA RARE (A) NONE SEEN   Squamous Epithelial / LPF 0-5 (A) NONE SEEN   WBC Clumps PRESENT    Mucus PRESENT    Hyaline Casts, UA PRESENT     Comment: Performed at [facility], [address] [place], [place] [address]  Blood gas, arterial     Status: Abnormal   Collection Time: 06/25/2017  5:49 PM  Result Value Ref Range   FIO2 0.34    Delivery systems NASAL CANNULA    pH, Arterial 7.28 (L) 7.350 - 7.450   pCO2 arterial 45 32.0 - 48.0 mmHg   pO2, Arterial 66 (L) 83.0 - 108.0 mmHg   Bicarbonate 21.1 20.0 - 28.0 mmol/L   Acid-base deficit 5.6 (H) 0.0 - 2.0 mmol/L   O2 Saturation 89.8 %   Patient temperature 37.0    Collection site RIGHT RADIAL    Sample type ARTERIAL DRAW    Allens test (pass/fail) PASS PASS    Comment: Performed at [facility], [address] [place], [place] [address]  Basic metabolic panel     Status: Abnormal   Collection Time: 06/21/17  6:25 AM  Result Value Ref Range   Sodium 135 135 - 145 mmol/L   Potassium 4.5 3.5 - 5.1 mmol/L   Chloride 103 101 - 111 mmol/L   CO2 21 (L) 22 - 32 mmol/L   Glucose, Bld 197 (H) 65 - 99 mg/dL   BUN 36 (H) 6 - 20 mg/dL   Creatinine, Ser 1.80 (H) 0.44 - 1.00 mg/dL   Calcium 8.2 (L) 8.9 - 10.3 mg/dL   GFR calc non Af Amer 25 (L) >60 mL/min   GFR calc Af Amer 29 (L) >60 mL/min  Comment: (NOTE) The eGFR has been calculated using the CKD EPI equation. This calculation has not been validated in all clinical situations. eGFR's persistently <60  mL/min signify possible Chronic Kidney Disease.    Anion gap 11 5 - 15    Comment: Performed at [facility], [address] [place], [place] [address]  CBC     Status: Abnormal   Collection Time: 06/21/17  6:25 AM  Result Value Ref Range   WBC 16.5 (H) 3.6 - 11.0 K/uL   RBC 3.64 (L) 3.80 - 5.20 MIL/uL   Hemoglobin 10.1 (L) 12.0 - 16.0 g/dL   HCT 31.4 (L) 35.0 - 47.0 %   MCV 86.3 80.0 - 100.0 fL   MCH 27.9 26.0 - 34.0 pg   MCHC 32.3 32.0 - 36.0 g/dL   RDW 20.0 (H) 11.5 - 14.5 %   Platelets 131 (L) 150 - 440 K/uL    Comment: Performed at [facility], [address] [place], [place] [address]  Protime-INR     Status: Abnormal   Collection Time: 06/21/17  6:25 AM  Result Value Ref Range   Prothrombin Time 28.3 (H) 11.4 - 15.2 seconds   INR 2.68     Comment: Performed at [facility], [address] [place], [place] [address]   No components found for: ESR, C REACTIVE PROTEIN MICRO: Recent Results (from the past 720 hour(s))  Blood Culture (routine x 2)     Status: None (Preliminary result)   Collection Time: 07/14/2017 11:03 AM  Result Value Ref Range Status   Specimen Description   Final    BLOOD RIGHT HAND Performed at [facility], [place] [address], [place], [place] [address]    Special Requests   Final    BOTTLES DRAWN AEROBIC AND ANAEROBIC Blood Culture adequate volume Performed at [place] [place], [place] [address]    Culture  Setup Time   Final    GRAM POSITIVE COCCI IN BOTH AEROBIC AND ANAEROBIC BOTTLES CRITICAL RESULT CALLED TO, READ BACK BY AND VERIFIED WITH: MATT MCBANE ON 06/21/17 AT 0354 BY JAG Performed at [facility], [place] [address], [place], [place] [address]    Culture GRAM POSITIVE COCCI  Final   Report Status PENDING  Incomplete  Blood Culture (routine x 2)     Status: None (Preliminary result)   Collection Time: 07/13/2017 11:03 AM  Result Value Ref Range Status   Specimen  Description   Final    BLOOD BLOOD RIGHT FOREARM Performed at [facility], [place] [address], [place], [place] [address]    Special Requests   Final    BOTTLES DRAWN AEROBIC AND ANAEROBIC Blood Culture results may not be optimal due to an excessive volume of blood received in culture bottles   Culture  Setup Time   Final    GRAM POSITIVE COCCI IN BOTH AEROBIC AND ANAEROBIC BOTTLES CRITICAL VALUE NOTED.  VALUE IS CONSISTENT WITH PREVIOUSLY REPORTED AND CALLED VALUE. Performed at [facility], [address] [place], [place] [address]    Culture [facility] POSITIVE COCCI  Final   Report Status PENDING  Incomplete  Blood Culture ID Panel (Reflexed)     Status: Abnormal   Collection Time:  11:03 AM  Result Value Ref Range Status   Enterococcus species DETECTED (A) NOT DETECTED Final    Comment: CRITICAL RESULT CALLED TO, READ BACK BY AND VERIFIED WITH: MATT MCBANE ON 06/21/17 AT  0354 BY JAG    Vancomycin resistance NOT DETECTED NOT DETECTED Final   Listeria monocytogenes NOT DETECTED NOT DETECTED Final   Staphylococcus species NOT DETECTED NOT DETECTED Final   Staphylococcus aureus NOT DETECTED NOT DETECTED Final   Streptococcus species NOT DETECTED NOT DETECTED Final   Streptococcus agalactiae NOT DETECTED NOT DETECTED Final   Streptococcus pneumoniae NOT DETECTED NOT DETECTED Final   Streptococcus pyogenes NOT DETECTED NOT DETECTED Final   Acinetobacter baumannii NOT DETECTED NOT DETECTED Final   Enterobacteriaceae species NOT DETECTED NOT DETECTED Final   Enterobacter cloacae complex NOT DETECTED NOT DETECTED Final   Escherichia coli NOT DETECTED NOT DETECTED Final   Klebsiella oxytoca NOT DETECTED NOT DETECTED Final   Klebsiella pneumoniae NOT DETECTED NOT DETECTED Final   Proteus species NOT DETECTED NOT DETECTED Final   Serratia marcescens NOT DETECTED NOT DETECTED Final   Haemophilus influenzae NOT DETECTED NOT DETECTED Final   Neisseria meningitidis NOT  DETECTED NOT DETECTED Final   Pseudomonas aeruginosa NOT DETECTED NOT DETECTED Final   Candida albicans NOT DETECTED NOT DETECTED Final   Candida glabrata NOT DETECTED NOT DETECTED Final   Candida krusei NOT DETECTED NOT DETECTED Final   Candida parapsilosis NOT DETECTED NOT DETECTED Final   Candida tropicalis NOT DETECTED NOT DETECTED Final    Comment: Performed at [facility], [address] [place], [place] [address]  Urine culture     Status: None (Preliminary result)   Collection Time: 07/09/2017  1:44 PM  Result Value Ref Range Status   Specimen Description   Final    URINE, RANDOM Performed at [facility], [address] [place], [place] [address]    Special Requests   Final    NONE Performed at [facility], [address] [place], [place] [address]    Culture   Final    CULTURE REINCUBATED FOR BETTER GROWTH Performed at [facility], [place] [address], [place], [place] [address]    Report Status PENDING  Incomplete    IMAGING: Dg Chest 1 View  Result Date: 06/21/2017 CLINICAL DATA:  Respiratory failure due to CHF and sepsis from UTI. EXAM: CHEST 1 VIEW COMPARISON:  06/18/2017 FINDINGS: Left-sided cardiac pacemaker unchanged. Lungs are adequately inflated demonstrate mild interval improvement of bilateral perihilar opacification compatible with mild interval improvement of interstitial edema. Small bilateral pleural effusions without significant change. Stable cardiomegaly. Remainder of the exam is unchanged. IMPRESSION: Slight interval improvement of moderate CHF. Electronically Signed   By: Marin Olp M.D.   On: 06/21/2017 08:35   Dg Chest Port 1 View  Result Date: 07/04/2017 CLINICAL DATA:  Dyspnea, weakness, shortness of breath EXAM: PORTABLE CHEST 1 VIEW COMPARISON:  CT chest 11/29/2016 FINDINGS: Diffuse bilateral interstitial and alveolar airspace opacities. Small bilateral pleural effusions. No pneumothorax. Stable cardiomegaly.  Prior aortic valve replacement. Dual lead cardiac pacemaker is present. Thoracic aortic atherosclerosis. No acute osseous abnormality. IMPRESSION: Findings consistent with CHF. Electronically Signed   By: Kathreen Devoid   On: 06/21/2017 11:19    Assessment:   LUVERN MISCHKE is a 83 y.o. female with hx mechanical prosthetic valves admitted with 3-4 weeks increasing weakness and SOB following an apparent febrile illness in the beginning of Feb.  She also had a fall and L elbow fx. She has obvious CHF on admit but wbc elevated at 27 and bcx + enterococcus. 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  TTE is pending. She is far too tenuous for TEE at this point.   I discussed the case with Dr Clayborn Bigness who will review TTE.  Discussed with daughter and patient as well the seriousness of prosthetic valve endocarditis. She is also allergic to PCN with  throat swelling so cannot use the abx of choice which would be combo of ampicllin and ceftriaxone.  The alternative treatment would be vancomycin and gent however her cr has also almost doubled so is at great risk of progressive renal failure esp with this nephrotoxic regimen.  Could consider desensitization to PCN as well.   Recommendations Repeat bcx.  Cont vanco for now awaiting final sensitivities.  Will see what cr is tomorrow before deciding on adding gent as no firm dx of endocarditis at this time Will plan on TEE if clinically improving.   Thank you very much for allowing me to participate in the care of this patient. Please call with questions.   Cheral Marker. Ola Spurr, MD

## 2017-06-21 NOTE — Progress Notes (Addendum)
Pharmacy Antibiotic Note  Jodi Waters is a 83 y.o. female admitted on 06/30/2017 with Enterococcus positive BCID.  Pharmacy has been consulted for vancomycin dosing.  Plan: DW 67kg  Vd 47L kei 0.032 hr-1  T1/2 22 hours Vancomycin 1 gram q 12 hours ordered with stacked dosing. Level before 5th dose. Goal trough 15-20.  Height: 5\' 3"  (160 cm) Weight: 148 lb (67.1 kg) IBW/kg (Calculated) : 52.4  Temp (24hrs), Avg:97.7 F (36.5 C), Min:97.3 F (36.3 C), Max:98.1 F (36.7 C)  Recent Labs  Lab 07/11/2017 1103  WBC 27.5*  CREATININE 1.24*  LATICACIDVEN 2.9*    Estimated Creatinine Clearance: 33.3 mL/min (A) (by C-G formula based on SCr of 1.24 mg/dL (H)).    Allergies  Allergen Reactions  . Penicillins Anaphylaxis    Has patient had a PCN reaction causing immediate rash, facial/tongue/throat swelling, SOB or lightheadedness with hypotension: Yes Has patient had a PCN reaction causing severe rash involving mucus membranes or skin necrosis: No Has patient had a PCN reaction that required hospitalization: Yes Has patient had a PCN reaction occurring within the last 10 years: No If all of the above answers are "NO", then may proceed with Cephalosporin use.     Antimicrobials this admission: aztreonam x1; Levaquin, vancomycin 3/4  >>    >>   Dose adjustments this admission:   Microbiology results: 3.5 BCx: BCID 4/4 GPC Enterococcus spp. vanA/B (-) 3/4 UCx: pending       3/4 UA: LE(+)  NO2(-)  WBC 6-30 3/4 CXR: CHF Thank you for allowing pharmacy to be a part of this patient's care.  Guiliana Shor S 06/21/2017 5:29 AM

## 2017-06-21 NOTE — Progress Notes (Signed)
*  PRELIMINARY RESULTS* Echocardiogram 2D Echocardiogram has been performed.  Sherrie Sport 06/21/2017, 11:29 AM

## 2017-06-21 NOTE — Consult Note (Signed)
Pharmacy Antibiotic Note  Jodi Waters is a 83 y.o. female admitted on 06/22/2017 with  UTI.  Pharmacy has been consulted for Levofloxacin dosing.    Plan: Patient received levofloxacin 750 mg IV on 3/4. Given renal function, will order levofloxacin 500 mg IV q48h  Height: 5\' 3"  (160 cm) Weight: 148 lb (67.1 kg) IBW/kg (Calculated) : 52.4  Temp (24hrs), Avg:97.8 F (36.6 C), Min:97.3 F (36.3 C), Max:98.1 F (36.7 C)  Recent Labs  Lab 06/25/2017 1103 06/21/17 0625  WBC 27.5* 16.5*  CREATININE 1.24* 1.80*  LATICACIDVEN 2.9*  --     Estimated Creatinine Clearance: 22.9 mL/min (A) (by C-G formula based on SCr of 1.8 mg/dL (H)).    Allergies  Allergen Reactions  . Penicillins Anaphylaxis    Has patient had a PCN reaction causing immediate rash, facial/tongue/throat swelling, SOB or lightheadedness with hypotension: Yes Has patient had a PCN reaction causing severe rash involving mucus membranes or skin necrosis: No Has patient had a PCN reaction that required hospitalization: Yes Has patient had a PCN reaction occurring within the last 10 years: No If all of the above answers are "NO", then may proceed with Cephalosporin use.     Antimicrobials this admission: Vanc/aztreonam >> x 1 dose 3/4 levofloxacin >>   Dose adjustments this admission:  Microbiology results: 3/4  BCx: enterococcus 3/4  UCx: pending  Thank you for allowing pharmacy to be a part of this patient's care.  Lenis Noon, PharmD, BCPS Clinical Pharmacist 06/21/2017 12:41 PM

## 2017-06-21 NOTE — Progress Notes (Signed)
Pt's family reports episode of vomiting.  NT went to check on patient and she was still nauseated.  On-call MD text-paged.

## 2017-06-21 NOTE — Consult Note (Signed)
Reason for Consult: Congestive heart failure shortness of breath Referring Physician: Dr. Anselm Jungling hospitalist Dr. Delmar Landau primary Cardiologist Dr. Rush Farmer is an 83 y.o. female.  HPI: Patient is a 83 year old female history of anxiety congestive heart failure cardiomyopathy valvular heart replacement AICD atrial fibrillation diabetes hypertension bilateral leg edema.  Patient had recently been seen for generalized weakness may have had a urinary tract infection is been seen by GI preop for colonoscopy but was not able to be cleared for the procedure patient presents with 3 days of respiratory distress came to the emergency room thought to be in sepsis possible UTI patient had shallow breath what appeared to be infected impending respiratory failure.  Patient denies any significant chest pain became lethargic and had mild altered mental status  Past Medical History:  Diagnosis Date  . Anxiety   . AVD (aortic valve disease)   . CHF (congestive heart failure) ([place])   . Depression   . Diabetes mellitus without complication (HCC)    diet controlled  . Dyspnea    with exertion  . Dysrhythmia    afib  . Guillain Barr syndrome ([place]) 01/2000  . Heart valve transplant recipient   . History of kidney stones   . Hyperlipidemia   . Hypertension   . Hypothyroidism   . Migraine   . Peripheral neuropathy   . Presence of permanent cardiac pacemaker     Past Surgical History:  Procedure Laterality Date  . ABDOMINAL HYSTERECTOMY    . CARDIAC CATHETERIZATION N/A 06/05/2015   Procedure: Right Heart Cath;  Surgeon: Teodoro Spray, MD;  Location: [facility];  Service: Cardiovascular;  Laterality: N/A;  . CARDIAC SURGERY    . CARDIAC VALVE REPLACEMENT     mitral and aortic valve  . CATARACT EXTRACTION    . CYSTOSCOPY W/ RETROGRADES Left 01/17/2017   Procedure: CYSTOSCOPY WITH RETROGRADE PYELOGRAM;  Surgeon: Hollice Espy, MD;  Location: [facility];  Service: Urology;   Laterality: Left;  . CYSTOSCOPY W/ URETERAL STENT PLACEMENT Left 01/17/2017   Procedure: CYSTOSCOPY WITH STENT REPLACEMENT;  Surgeon: Hollice Espy, MD;  Location: [facility];  Service: Urology;  Laterality: Left;  . CYSTOSCOPY/URETEROSCOPY/HOLMIUM LASER/STENT PLACEMENT Left 01/04/2017   Procedure: CYSTOSCOPY/URETEROSCOPY/HOLMIUM LASER/STENT PLACEMENT;  Surgeon: Hollice Espy, MD;  Location: [facility];  Service: Urology;  Laterality: Left;  . EYE SURGERY Bilateral    Cataract Extraction with IOL  . INSERT / REPLACE / REMOVE PACEMAKER    . ORIF WRIST FRACTURE Left 10/31/2015   Procedure: OPEN REDUCTION INTERNAL FIXATION (ORIF) WRIST FRACTURE;  Surgeon: Hessie Knows, MD;  Location: [facility];  Service: Orthopedics;  Laterality: Left;  . PACEMAKER INSERTION    . PACEMAKER INSERTION  2012   patient states still has this one 12/21/2016  . TONSILLECTOMY    . URETEROSCOPY Left 01/17/2017   Procedure: URETEROSCOPY;  Surgeon: Hollice Espy, MD;  Location: [facility];  Service: Urology;  Laterality: Left;    Family History  Problem Relation Age of Onset  . Bone cancer Maternal Uncle   . Bone cancer Maternal Grandmother   . Kidney cancer Neg Hx   . Bladder Cancer Neg Hx     Social History:  reports that  has never smoked. she has never used smokeless tobacco. She reports that she does not drink alcohol or use drugs.  Allergies:  Allergies  Allergen Reactions  . Penicillins Anaphylaxis    Has patient had a PCN reaction causing immediate rash, facial/tongue/throat swelling,  SOB or lightheadedness with hypotension: Yes Has patient had a PCN reaction causing severe rash involving mucus membranes or skin necrosis: No Has patient had a PCN reaction that required hospitalization: Yes Has patient had a PCN reaction occurring within the last 10 years: No If all of the above answers are "NO", then may proceed with Cephalosporin use.     Medications: I have reviewed the patient's current  medications.  Results for orders placed or performed during the hospital encounter of 06/19/2017 (from the past 48 hour(s))  Lactic acid, plasma     Status: Abnormal   Collection Time: 07/04/2017 11:03 AM  Result Value Ref Range   Lactic Acid, Venous 2.9 (HH) 0.5 - 1.9 mmol/L    Comment: CRITICAL RESULT CALLED TO, READ BACK BY AND VERIFIED WITH ASHLEY SMITH AT 1217 07/04/2017 DAS Performed at [facility], [address] [place], [place] [address]   Comprehensive metabolic panel     Status: Abnormal   Collection Time: 06/23/2017 11:03 AM  Result Value Ref Range   Sodium 135 135 - 145 mmol/L   Potassium 3.9 3.5 - 5.1 mmol/L   Chloride 102 101 - 111 mmol/L   CO2 20 (L) 22 - 32 mmol/L   Glucose, Bld 181 (H) 65 - 99 mg/dL   BUN 24 (H) 6 - 20 mg/dL   Creatinine, Ser 1.24 (H) 0.44 - 1.00 mg/dL   Calcium 8.6 (L) 8.9 - 10.3 mg/dL   Total Protein 6.8 6.5 - 8.1 g/dL   Albumin 3.0 (L) 3.5 - 5.0 g/dL   AST 30 15 - 41 U/L   ALT 13 (L) 14 - 54 U/L   Alkaline Phosphatase 98 38 - 126 U/L   Total Bilirubin 2.4 (H) 0.3 - 1.2 mg/dL   GFR calc non Af Amer 40 (L) >60 mL/min   GFR calc Af Amer 46 (L) >60 mL/min    Comment: (NOTE) The eGFR has been calculated using the CKD EPI equation. This calculation has not been validated in all clinical situations. eGFR's persistently <60 mL/min signify possible Chronic Kidney Disease.    Anion gap 13 5 - 15    Comment: Performed at [facility], [address] [place], [place] [address]  Troponin I     Status: Abnormal   Collection Time: 06/26/2017 11:03 AM  Result Value Ref Range   Troponin I 0.35 (HH) <0.03 ng/mL    Comment: CRITICAL RESULT CALLED TO, READ BACK BY AND VERIFIED WITH ASHLEY SMITH _0  07/07/2017 AKT Performed at [facility], [address] [place], [place] [address]   CBC WITH DIFFERENTIAL     Status: Abnormal   Collection Time: 07/10/2017 11:03 AM  Result Value Ref Range   WBC 27.5 (H) 3.6 - 11.0 K/uL   RBC 4.10  3.80 - 5.20 MIL/uL   Hemoglobin 11.5 (L) 12.0 - 16.0 g/dL   HCT 35.6 35.0 - 47.0 %   MCV 86.8 80.0 - 100.0 fL   MCH 28.0 26.0 - 34.0 pg   MCHC 32.3 32.0 - 36.0 g/dL   RDW 20.6 (H) 11.5 - 14.5 %   Platelets 175 150 - 440 K/uL   Neutrophils Relative % 93 %   Neutro Abs 25.6 (H) 1.4 - 6.5 K/uL   Lymphocytes Relative 1 %   Lymphs Abs 0.4 (L) 1.0 - 3.6 K/uL   Monocytes Relative 6 %   Monocytes Absolute 1.5 (H) 0.2 - 0.9 K/uL   Eosinophils Relative 0 %   Eosinophils  Absolute 0.0 0 - 0.7 K/uL   Basophils Relative 0 %   Basophils Absolute 0.0 0 - 0.1 K/uL    Comment: Performed at [facility], [address] [place], [place] [address]  Blood gas, venous ([facility], [facility], [facility])     Status: Abnormal   Collection Time: 07/02/2017 11:03 AM  Result Value Ref Range   pH, Ven 7.35 7.250 - 7.430   pCO2, Ven 38 (L) 44.0 - 60.0 mmHg   pO2, Ven 61.0 (H) 32.0 - 45.0 mmHg   Bicarbonate 21.0 20.0 - 28.0 mmol/L   Acid-base deficit 4.2 (H) 0.0 - 2.0 mmol/L   O2 Saturation 89.6 %   Patient temperature 37.0    Collection site VEIN    Sample type VENOUS     Comment: Performed at [facility], [address], [place], [place] [address]  Blood Culture (routine x 2)     Status: None (Preliminary result)   Collection Time: 06/29/2017 11:03 AM  Result Value Ref Range   Specimen Description      BLOOD RIGHT HAND Performed at [facility] [address] [place], [place] [address]    Special Requests      BOTTLES DRAWN AEROBIC AND ANAEROBIC Blood Culture adequate volume Performed at [place], [place] [address]    Culture  Setup Time      GRAM POSITIVE COCCI IN BOTH AEROBIC AND ANAEROBIC BOTTLES CRITICAL RESULT CALLED TO, READ BACK BY AND VERIFIED WITH: MATT MCBANE ON 06/21/17 AT 0354 BY JAG Performed at [facility], [place] [address], [place], [place] [address]    Culture GRAM POSITIVE COCCI    Report Status PENDING   Blood Culture (routine x  2)     Status: None (Preliminary result)   Collection Time: 06/18/2017 11:03 AM  Result Value Ref Range   Specimen Description      BLOOD BLOOD RIGHT FOREARM Performed at [facility], [place] [address], [place], [place] [address]    Special Requests      BOTTLES DRAWN AEROBIC AND ANAEROBIC Blood Culture results may not be optimal due to an excessive volume of blood received in culture bottles   Culture  Setup Time      GRAM POSITIVE COCCI IN BOTH AEROBIC AND ANAEROBIC BOTTLES CRITICAL VALUE NOTED.  VALUE IS CONSISTENT WITH PREVIOUSLY REPORTED AND CALLED VALUE. Performed at [facility], [address] [place], [place] [address]    Culture GRAM POSITIVE COCCI    Report Status PENDING   Influenza panel by PCR (type A & B)     Status: None   Collection Time: 07/07/2017 11:03 AM  Result Value Ref Range   Influenza A By PCR NEGATIVE NEGATIVE   Influenza B By PCR NEGATIVE NEGATIVE    Comment: (NOTE) The Xpert Xpress Flu assay is intended as an aid in the diagnosis of  influenza and should not be used as a sole basis for treatment.  This  assay is FDA approved for nasopharyngeal swab specimens only. Nasal  washings and aspirates are unacceptable for Xpert Xpress Flu testing. Performed at [facility], [address] [place], [place] [address]   Protime-INR     Status: Abnormal   Collection Time: 06/28/2017 11:03 AM  Result Value Ref Range   Prothrombin Time 24.1 (H) 11.4 - 15.2 seconds   INR 2.18     Comment: Performed at [facility], [address] [place], [place] [address]  Blood Culture ID Panel (Reflexed)     Status: Abnormal   Collection Time: 07/02/2017 11:03 AM  Result Value Ref Range   Enterococcus species DETECTED (A) NOT DETECTED    Comment: CRITICAL RESULT CALLED TO, READ BACK BY AND VERIFIED WITH: MATT MCBANE ON 06/21/17 AT 0354 BY JAG    Vancomycin resistance NOT DETECTED NOT DETECTED   Listeria monocytogenes NOT DETECTED NOT DETECTED    Staphylococcus species NOT DETECTED NOT DETECTED   Staphylococcus aureus NOT DETECTED NOT DETECTED   Streptococcus species NOT DETECTED NOT DETECTED   Streptococcus agalactiae NOT DETECTED NOT DETECTED   Streptococcus pneumoniae NOT DETECTED NOT DETECTED   Streptococcus pyogenes NOT DETECTED NOT DETECTED   Acinetobacter baumannii NOT DETECTED NOT DETECTED   Enterobacteriaceae species NOT DETECTED NOT DETECTED   Enterobacter cloacae complex NOT DETECTED NOT DETECTED   Escherichia coli NOT DETECTED NOT DETECTED   Klebsiella oxytoca NOT DETECTED NOT DETECTED   Klebsiella pneumoniae NOT DETECTED NOT DETECTED   Proteus species NOT DETECTED NOT DETECTED   Serratia marcescens NOT DETECTED NOT DETECTED   Haemophilus influenzae NOT DETECTED NOT DETECTED   Neisseria meningitidis NOT DETECTED NOT DETECTED   Pseudomonas aeruginosa NOT DETECTED NOT DETECTED   Candida albicans NOT DETECTED NOT DETECTED   Candida glabrata NOT DETECTED NOT DETECTED   Candida krusei NOT DETECTED NOT DETECTED   Candida parapsilosis NOT DETECTED NOT DETECTED   Candida tropicalis NOT DETECTED NOT DETECTED    Comment: Performed at [facility], [address], [place], [place] [address]  Urine culture     Status: None (Preliminary result)   Collection Time: 06/22/2017  1:44 PM  Result Value Ref Range   Specimen Description      URINE, RANDOM Performed at [facility], [address] [place], [place] [address]    Special Requests      NONE Performed at [facility], [address], [place], [place] [address]    Culture      CULTURE REINCUBATED FOR BETTER GROWTH Performed at [facility], [place] [address] [place], [place] [address]    Report Status PENDING   Urinalysis, Complete w Microscopic     Status: Abnormal   Collection Time: 06/28/2017  1:44 PM  Result Value Ref Range   Color, Urine YELLOW (A) YELLOW   APPearance HAZY (A) CLEAR   Specific Gravity, Urine 1.011 1.005 -  1.030   pH 5.0 5.0 - 8.0   Glucose, UA NEGATIVE NEGATIVE mg/dL   Hgb urine dipstick MODERATE (A) NEGATIVE   Bilirubin Urine NEGATIVE NEGATIVE   Ketones, ur NEGATIVE NEGATIVE mg/dL   Protein, ur NEGATIVE NEGATIVE mg/dL   Nitrite NEGATIVE NEGATIVE   Leukocytes, UA SMALL (A) NEGATIVE   RBC / HPF 0-5 0 - 5 RBC/hpf   WBC, UA 6-30 0 - 5 WBC/hpf   Bacteria, UA RARE (A) NONE SEEN   Squamous Epithelial / LPF 0-5 (A) NONE SEEN   WBC Clumps PRESENT    Mucus PRESENT    Hyaline Casts, UA PRESENT     Comment: Performed at [facility], [address] [place], [place] [address]  Blood gas, arterial     Status: Abnormal   Collection Time: 06/29/2017  5:49 PM  Result Value Ref Range   FIO2 0.34    Delivery systems NASAL CANNULA    pH, Arterial 7.28 (L) 7.350 - 7.450   pCO2 arterial 45 32.0 - 48.0 mmHg  pO2, Arterial 66 (L) 83.0 - 108.0 mmHg   Bicarbonate 21.1 20.0 - 28.0 mmol/L   Acid-base deficit 5.6 (H) 0.0 - 2.0 mmol/L   O2 Saturation 89.8 %   Patient temperature 37.0    Collection site RIGHT RADIAL    Sample type ARTERIAL DRAW    Allens test (pass/fail) PASS PASS    Comment: Performed at [facility], [address] [place], [place] [address]  Basic metabolic panel     Status: Abnormal   Collection Time: 06/21/17  6:25 AM  Result Value Ref Range   Sodium 135 135 - 145 mmol/L   Potassium 4.5 3.5 - 5.1 mmol/L   Chloride 103 101 - 111 mmol/L   CO2 21 (L) 22 - 32 mmol/L   Glucose, Bld 197 (H) 65 - 99 mg/dL   BUN 36 (H) 6 - 20 mg/dL   Creatinine, Ser 1.80 (H) 0.44 - 1.00 mg/dL   Calcium 8.2 (L) 8.9 - 10.3 mg/dL   GFR calc non Af Amer 25 (L) >60 mL/min   GFR calc Af Amer 29 (L) >60 mL/min    Comment: (NOTE) The eGFR has been calculated using the CKD EPI equation. This calculation has not been validated in all clinical situations. eGFR's persistently <60 mL/min signify possible Chronic Kidney Disease.    Anion gap 11 5 - 15    Comment: Performed at [facility], [address] [place], [place] [address]  CBC     Status: Abnormal   Collection Time: 06/21/17  6:25 AM  Result Value Ref Range   WBC 16.5 (H) 3.6 - 11.0 K/uL   RBC 3.64 (L) 3.80 - 5.20 MIL/uL   Hemoglobin 10.1 (L) 12.0 - 16.0 g/dL   HCT 31.4 (L) 35.0 - 47.0 %   MCV 86.3 80.0 - 100.0 fL   MCH 27.9 26.0 - 34.0 pg   MCHC 32.3 32.0 - 36.0 g/dL   RDW 20.0 (H) 11.5 - 14.5 %   Platelets 131 (L) 150 - 440 K/uL    Comment: Performed at [facility], [address] [place], [place] [address]  Protime-INR     Status: Abnormal   Collection Time: 06/21/17  6:25 AM  Result Value Ref Range   Prothrombin Time 28.3 (H) 11.4 - 15.2 seconds   INR 2.68     Comment: Performed at [facility], [address] [place], [place] [address]    Dg Chest 1 View  Result Date: 06/21/2017 CLINICAL DATA:  Respiratory failure due to CHF and sepsis from UTI. EXAM: CHEST 1 VIEW COMPARISON:  07/08/2017 FINDINGS: Left-sided cardiac pacemaker unchanged. Lungs are adequately inflated demonstrate mild interval improvement of bilateral perihilar opacification compatible with mild interval improvement of interstitial edema. Small bilateral pleural effusions without significant change. Stable cardiomegaly. Remainder of the exam is unchanged. IMPRESSION: Slight interval improvement of moderate CHF. Electronically Signed   By: Marin Olp M.D.   On: 06/21/2017 08:35   Dg Chest Port 1 View  Result Date: 06/19/2017 CLINICAL DATA:  Dyspnea, weakness, shortness of breath EXAM: PORTABLE CHEST 1 VIEW COMPARISON:  CT chest 11/29/2016 FINDINGS: Diffuse bilateral interstitial and alveolar airspace opacities. Small bilateral pleural effusions. No pneumothorax. Stable cardiomegaly. Prior aortic valve replacement. Dual lead cardiac pacemaker is present. Thoracic aortic atherosclerosis. No acute osseous abnormality. IMPRESSION: Findings consistent with CHF. Electronically Signed   By: Kathreen Devoid   On:  07/12/2017 11:19    Review of Systems  Constitutional: Positive for diaphoresis  and malaise/fatigue.  HENT: Positive for congestion.   Eyes: Negative.   Respiratory: Positive for cough and shortness of breath.   Cardiovascular: Positive for orthopnea, leg swelling and PND.  Gastrointestinal: Negative.   Genitourinary: Positive for dysuria.  Musculoskeletal: Negative.   Skin: Negative.   Neurological: Positive for weakness.  Endo/Heme/Allergies: Negative.   Psychiatric/Behavioral: Negative.    Blood pressure (!) 131/34, pulse 66, temperature 98.1 F (36.7 C), temperature source Oral, resp. rate (!) 35, height 5' 3" (1.6 m), weight 148 lb (67.1 kg), SpO2 94 %. Physical Exam  Nursing note and vitals reviewed. Constitutional: She appears well-developed and well-nourished. She appears lethargic.  HENT:  Head: Normocephalic and atraumatic.  Eyes: Conjunctivae and EOM are normal. Pupils are equal, round, and reactive to light.  Neck: Normal range of motion. Neck supple.  Cardiovascular: Normal pulses. An irregularly irregular rhythm present. Exam reveals gallop and S3.  Murmur heard.  Systolic murmur is present with a grade of 2/6. Respiratory: She has decreased breath sounds. She has rhonchi. She has rales.  GI: Soft. Bowel sounds are normal.  Musculoskeletal: Normal range of motion. She exhibits edema.  Neurological: She appears lethargic.  Skin: Skin is warm and dry.  Psychiatric: She has a normal mood and affect.    Assessment/Plan: Shortness of breath Congestive heart failure Aortic valve disease Anxiety Atrial fibrillation Diabetes Hypertension Hyperlipidemia Permanent pacemaker Sepsis Chronic renal insufficiency Elevated troponin Coagulopathy secondary to Coumadin . Plan With admission to telemetry Continue diuretic therapy  Continue supplemental oxygen therapy Maintain to look for hyperlipidemia Continue losartan for hypertension Supplemental oxygen possible  BiPAP for shortness of breath Echocardiogram for assessment of left ventricular function Hypothyroidism continue levothyroxine Elevated LFTs continue to follow and monitor Rate control for atrial fibrillation  Dwayne D Callwood 06/21/2017, 4:58 PM

## 2017-06-21 NOTE — Progress Notes (Signed)
Pharmacy Antibiotic Note  Jodi Waters is a 83 y.o. female admitted on 07/01/2017 with Enterococcus positive BCID.  Pharmacy has been consulted for vancomycin dosing.  Plan: Pt has had decline in renal function. I will adjust dose to vancomycin 750mg  q 36 hours. Trough prior to the 4th dose 3/9 @ 1730. Will follow renal function closely for needed dose adjustment  Height: 5\' 3"  (160 cm) Weight: 148 lb (67.1 kg) IBW/kg (Calculated) : 52.4  Temp (24hrs), Avg:97.7 F (36.5 C), Min:97.3 F (36.3 C), Max:98.1 F (36.7 C)  Recent Labs  Lab 06/28/2017 1103 06/21/17 0625  WBC 27.5* 16.5*  CREATININE 1.24* 1.80*  LATICACIDVEN 2.9*  --     Estimated Creatinine Clearance: 22.9 mL/min (A) (by C-G formula based on SCr of 1.8 mg/dL (H)).    Allergies  Allergen Reactions  . Penicillins Anaphylaxis    Has patient had a PCN reaction causing immediate rash, facial/tongue/throat swelling, SOB or lightheadedness with hypotension: Yes Has patient had a PCN reaction causing severe rash involving mucus membranes or skin necrosis: No Has patient had a PCN reaction that required hospitalization: Yes Has patient had a PCN reaction occurring within the last 10 years: No If all of the above answers are "NO", then may proceed with Cephalosporin use.     Antimicrobials this admission: aztreonam x1; Levaquin, vancomycin 3/4  >>    >>   Dose adjustments this admission:   Microbiology results: 3.5 BCx: BCID 4/4 GPC Enterococcus spp. vanA/B (-) 3/4 UCx: pending       3/4 UA: LE(+)  NO2(-)  WBC 6-30 3/4 CXR: CHF Thank you for allowing pharmacy to be a part of this patient's care.  Ramond Dial 06/21/2017 9:07 AM

## 2017-06-22 LAB — CBC
HEMATOCRIT: 31 % — AB (ref 35.0–47.0)
Hemoglobin: 10.2 g/dL — ABNORMAL LOW (ref 12.0–16.0)
MCH: 28.4 pg (ref 26.0–34.0)
MCHC: 32.8 g/dL (ref 32.0–36.0)
MCV: 86.7 fL (ref 80.0–100.0)
PLATELETS: 112 10*3/uL — AB (ref 150–440)
RBC: 3.58 MIL/uL — ABNORMAL LOW (ref 3.80–5.20)
RDW: 20.1 % — AB (ref 11.5–14.5)
WBC: 13.6 10*3/uL — ABNORMAL HIGH (ref 3.6–11.0)

## 2017-06-22 LAB — ECHOCARDIOGRAM COMPLETE
HEIGHTINCHES: 63 in
Weight: 2368 oz

## 2017-06-22 LAB — PROTIME-INR
INR: 3.22
Prothrombin Time: 32.7 seconds — ABNORMAL HIGH (ref 11.4–15.2)

## 2017-06-22 LAB — COMPREHENSIVE METABOLIC PANEL
ALT: 12 U/L — AB (ref 14–54)
AST: 23 U/L (ref 15–41)
Albumin: 2.4 g/dL — ABNORMAL LOW (ref 3.5–5.0)
Alkaline Phosphatase: 77 U/L (ref 38–126)
Anion gap: 7 (ref 5–15)
BILIRUBIN TOTAL: 1.5 mg/dL — AB (ref 0.3–1.2)
BUN: 52 mg/dL — ABNORMAL HIGH (ref 6–20)
CHLORIDE: 102 mmol/L (ref 101–111)
CO2: 24 mmol/L (ref 22–32)
CREATININE: 2.58 mg/dL — AB (ref 0.44–1.00)
Calcium: 8.3 mg/dL — ABNORMAL LOW (ref 8.9–10.3)
GFR, EST AFRICAN AMERICAN: 19 mL/min — AB (ref >60)
GFR, EST NON AFRICAN AMERICAN: 16 mL/min — AB (ref >60)
Glucose, Bld: 202 mg/dL — ABNORMAL HIGH (ref 65–99)
POTASSIUM: 4.6 mmol/L (ref 3.5–5.1)
Sodium: 133 mmol/L — ABNORMAL LOW (ref 135–145)
TOTAL PROTEIN: 5.9 g/dL — AB (ref 6.5–8.1)

## 2017-06-22 LAB — URINE CULTURE

## 2017-06-22 LAB — LACTIC ACID, PLASMA: Lactic Acid, Venous: 1.4 mmol/L (ref 0.5–1.9)

## 2017-06-22 MED ORDER — ENSURE ENLIVE PO LIQD
237.0000 mL | Freq: Two times a day (BID) | ORAL | Status: DC
  Administered 2017-06-22: 237 mL via ORAL

## 2017-06-22 MED ORDER — MORPHINE SULFATE (PF) 2 MG/ML IV SOLN
1.0000 mg | INTRAVENOUS | Status: DC | PRN
  Administered 2017-06-23: 1 mg via INTRAVENOUS
  Filled 2017-06-22: qty 1

## 2017-06-22 NOTE — Progress Notes (Signed)
Family Meeting Note  Advance Directive:yes  Today a meeting took place with the daughter and son in law.  Patient is unable to participate due [id number] capacity drowsy   The following clinical team members were present during this meeting:MD  The following were discussed:Patient's diagnosis: Bacteremia, Ac renal failure,. CHF, Sepsis, Ac respi failure., Patient's progosis: < 2 weeks and Goals for treatment: DNR   I had discussion about her condition and very poor prognosis with family. They understands and agreed on comfort measures only, and stop all other interventions.  Additional follow-up to be provided: comfort care.  Time spent during discussion:20 minutes  Vaughan Basta, MD

## 2017-06-22 NOTE — Care Management (Signed)
Informed by attending that patient is being made comfort care. Remains on HFNC.  There is a palliative consult pending.

## 2017-06-22 NOTE — Progress Notes (Signed)
ID note Reviewed notes- plan for comfort care. Will sign off but please call if needed.

## 2017-06-22 NOTE — Plan of Care (Signed)
Pt is comfort care. Not eating or drinking. Changed from HFNC to [place] 4 L. Family at bedside. Education done on  end of life expectaions.

## 2017-06-22 NOTE — Progress Notes (Signed)
[facility] NAME: Jodi Waters    MR#:  [id number]  DATE OF BIRTH:  05/20/1934  SUBJECTIVE:  CHIEF COMPLAINT:   Chief Complaint  Patient presents with  . Shortness of Breath   Came with generalized weakness and worsening shortness of breath for last few weeks. She also had a fall 2 weeks ago and has a cast on her left upper extremity. Patient was on high flow nasal cannula oxygen, and not much improvement but worsening renal function.  She is slightly more lethargic today, her daughter had agreed on comfort measures today.   REVIEW OF SYSTEMS:  Patient is lethargic and cannot give a review of system.  ROS  DRUG ALLERGIES:   Allergies  Allergen Reactions  . Penicillins Anaphylaxis    Has patient had a PCN reaction causing immediate rash, facial/tongue/throat swelling, SOB or lightheadedness with hypotension: Yes Has patient had a PCN reaction causing severe rash involving mucus membranes or skin necrosis: No Has patient had a PCN reaction that required hospitalization: Yes Has patient had a PCN reaction occurring within the last 10 years: No If all of the above answers are "NO", then may proceed with Cephalosporin use.     VITALS:  Blood pressure (!) 96/39, pulse 84, temperature 97.7 F (36.5 C), temperature source Oral, resp. rate (!) 28, height 5' 3.5" (1.613 m), weight 65.1 kg (143 lb 9.6 oz), SpO2 98 %.  PHYSICAL EXAMINATION:   GENERAL:  83 y.o.-year-old patient lying in the bed with no acute distress.  EYES: Pupils equal, round, reactive to light and accommodation. No scleral icterus. Extraocular muscles intact.  HEENT: Head atraumatic, normocephalic. Oropharynx and nasopharynx clear.  NECK:  Supple, no jugular venous distention. No thyroid enlargement, no tenderness.  LUNGS: Normal breath sounds bilaterally, no wheezing, bilateral crepitation. No use of accessory muscles of respiration. On high flow nasal cannula  oxygen currently. CARDIOVASCULAR: S1, S2 normal. systolic murmurs, AICD in place. ABDOMEN: Soft, nontender, nondistended. Bowel sounds present. No organomegaly or mass.  EXTREMITIES: No pedal edema, cyanosis, or clubbing. Left upper extremity is in cast. NEUROLOGIC:Patient is lethargic, opens eyes to stimuli but does not follow commands.  PSYCHIATRIC: The patient islethargic.  SKIN: No obvious rash, lesion, or ulcer.     Physical Exam LABORATORY PANEL:   CBC Recent Labs  Lab 06/22/17 0343  WBC 13.6*  HGB 10.2*  HCT 31.0*  PLT 112*   ------------------------------------------------------------------------------------------------------------------  Chemistries  Recent Labs  Lab 06/22/17 0343  NA 133*  K 4.6  CL 102  CO2 24  GLUCOSE 202*  BUN 52*  CREATININE 2.58*  CALCIUM 8.3*  AST 23  ALT 12*  ALKPHOS 77  BILITOT 1.5*   ------------------------------------------------------------------------------------------------------------------  Cardiac Enzymes Recent Labs  Lab 06/18/2017 1103  TROPONINI 0.35*   ------------------------------------------------------------------------------------------------------------------  RADIOLOGY:  Dg Chest 1 View  Result Date: 06/21/2017 CLINICAL DATA:  Respiratory failure due to CHF and sepsis from UTI. EXAM: CHEST 1 VIEW COMPARISON:  07/11/2017 FINDINGS: Left-sided cardiac pacemaker unchanged. Lungs are adequately inflated demonstrate mild interval improvement of bilateral perihilar opacification compatible with mild interval improvement of interstitial edema. Small bilateral pleural effusions without significant change. Stable cardiomegaly. Remainder of the exam is unchanged. IMPRESSION: Slight interval improvement of moderate CHF. Electronically Signed   By: Marin Olp M.D.   On: 06/21/2017 08:35    ASSESSMENT AND PLAN:   Principal Problem:   Acute on chronic systolic CHF (congestive heart failure) (HCC) Active Problems:  Sepsis ([place])   CHF (congestive heart failure) (HCC)  * Acute respiratory failure with hypoxia   Acute on chronic diastolic congestive heart failure    IV Lasix, intake and output measurement, echocardiogram, cardiology consult.   Hold Lasix today has patient has worsening in renal function.   Patient was on high flow nasal cannula oxygen, but now family agreed on comfort care.  * Bacteremia   Patient have a mechanical heart valve, there is a concern for endocarditis.   IV vancomycin for now and pharmacy to adjust the dosing.   Repeat blood cultures sent, infectious disease consult.   Family agreed on comfort measures only.    * Status post AICD and mechanical heart valve, A. fib   Continue warfarin and pharmacy to help dosing.    Continue digoxin and verapamil.  * Sepsis   Likely due to UTI and bacteremia   Levaquin per pharmacy dosing.  * Acute renal failure   Slight worsening in renal function than her baseline, we will continue monitoring with use of IV Lasix.   Lasix held as there is worsening in renal function.  * Hypertension   Continue Lasix, losartan.  * Hyperlipidemia   Continue Zocor.  * Hypothyroidism   Continue levothyroxine.  * Elevated LFTs.   Currently we will monitor, due to worsening in the CHF that may be possible.     All the records are reviewed and case discussed with Care Management/Social Workerr. Management plans discussed with the patient, family and they are in agreement.  CODE STATUS: DNR  TOTAL TIME TAKING CARE OF THIS PATIENT: 40 minutes.  Explained patient's daughter and son-in-law in the room about her critical conditions present with bacteremia, possibility of endocarditis, worsening renal function, requiring high flow nasal cannula oxygen. Confirmed her CODE STATUS being DO NOT RESUSCITATE with them. They suggested, patient would never have wanted to go through all this and agreed to give comfort care.  POSSIBLE D/C IN 2-3  DAYS, DEPENDING ON CLINICAL CONDITION.   Vaughan Basta M.D on 06/22/2017   Between 7am to 6pm - Pager - [phone]  After 6pm go to [email or web address] - password [email or web address]  Office  [phone]  CC: Primary care physician; Maryland Pink, MD  Note: This dictation was prepared with Dragon dictation along with smaller phrase technology. Any transcriptional errors that result from this process are unintentional.

## 2017-06-22 NOTE — Progress Notes (Signed)
Report called to Esmond Harps RN. Family was made aware of transport. Pt has no further concerns at this time.

## 2017-06-23 LAB — CULTURE, BLOOD (ROUTINE X 2): SPECIAL REQUESTS: ADEQUATE

## 2017-06-23 MED ORDER — GLYCOPYRROLATE 0.2 MG/ML IJ SOLN
0.1000 mg | Freq: Four times a day (QID) | INTRAMUSCULAR | Status: DC
  Administered 2017-06-23 – 2017-06-24 (×3): 0.1 mg via INTRAVENOUS
  Filled 2017-06-23 (×6): qty 0.5

## 2017-06-23 MED ORDER — MORPHINE BOLUS VIA INFUSION: 2.0000 mg | INTRAVENOUS | Status: DC | PRN

## 2017-06-23 MED ORDER — LORAZEPAM BOLUS VIA INFUSION: 1.0000 mg | INTRAVENOUS | Status: DC | PRN

## 2017-06-23 MED ORDER — MORPHINE SULFATE (PF) 2 MG/ML IV SOLN
1.0000 mg | Freq: Once | INTRAVENOUS | Status: AC
  Administered 2017-06-23: 1 mg via INTRAVENOUS
  Filled 2017-06-23: qty 1

## 2017-06-23 MED ORDER — MORPHINE SULFATE (PF) 2 MG/ML IV SOLN
2.0000 mg | INTRAVENOUS | Status: DC | PRN
  Administered 2017-06-23: 2 mg via INTRAVENOUS
  Filled 2017-06-23: qty 1

## 2017-06-23 MED ORDER — LORAZEPAM 2 MG/ML IJ SOLN: 1.0000 mg | INTRAMUSCULAR | Status: DC | PRN

## 2017-06-23 NOTE — Progress Notes (Signed)
    1700  Clinical Encounter Type  Visited With Patient and family together  Visit Type Initial  Referral From Nurse  Consult/Referral To Chaplain  Spiritual Encounters  Spiritual Needs Emotional;Grief support   CH received an OR to check on Family of PT as PT is at end of life. [place] reported to PT's RM and offered emotional support and ministry of presences.

## 2017-06-23 NOTE — Progress Notes (Addendum)
[employer] notified per MD order.

## 2017-06-23 NOTE — Progress Notes (Signed)
 PT Cancellation Note  Patient Details Name: Jodi Waters MRN: [id number] DOB: 01-23-1935   Cancelled Treatment:    Reason Eval/Treat Not Completed: Medical issues which prohibited therapy(Per chart review, patient now transitioned to comfort care.  Will complete PT orders at this time; please re-consult should patient needs or goals of care change.)   Lanson Randle H. Owens Shark, PT, DPT, NCS , 9:20 AM [phone]

## 2017-06-23 NOTE — Care Management Important Message (Signed)
 Important Message  Patient Details  Name: Jodi Waters MRN: [id number] Date of Birth: 12/18/1934   Medicare Important Message Given:  Yes    Shelbie Ammons, RN , 6:38 AM

## 2017-06-23 NOTE — Progress Notes (Signed)
Family Meeting Note  Advance Directive:yes  Today a meeting took place with the Patient and son and daughter at bedside.  Patient is unable to participate due XE:NMMHWK capacity Comatose   The following clinical team members were present during this meeting:MD  The following were discussed:Patient's diagnosis:   Jodi Waters is a 83 y.o. female with hx mechanical prosthetic valves admitted with 3-4 weeks increasing weakness and SOB. She also had a fall and L elbow fx. ID concerned about prosthetic valve endocarditis.   Patient's progosis: < 2 weeks and Goals for treatment: DNR  Additional follow-up to be provided: Comfort care only. Seem to be actively dying. If still alive tomorrow consider Hospice Home placement. Family is in agreement.  Time spent during discussion:20 minutes  Max Sane, MD

## 2017-06-23 NOTE — Progress Notes (Signed)
Clinical Education officer, museum (CSW) received consult for comfort care. CSW will follow for inpatient hospice needs.   [employer], LCSW [phone]

## 2017-06-23 NOTE — Progress Notes (Signed)
Patient seem to be actively dying. D/w family at bedside.  Will place nurse may pronounce orders. Withdrawal of care orders, turn off pacemaker.  Increase morphine dose as she seems in discomfort and gasping for breath. If she remains alive, may consider Hospice Home placement tomorrow.  Add robinul for secretions.   Time spent: 20 mins

## 2017-06-23 NOTE — Progress Notes (Signed)
    0930  Clinical Encounter Type  Visited With Patient and family together  Visit Type Initial  Referral From Nurse  Consult/Referral To Chaplain  Spiritual Encounters  Spiritual Needs Prayer;Emotional;Grief support   CH was asked by charge nurse to visit PT and family. CH offered prayer and emotional support. Will follow up as needed.

## 2017-06-26 LAB — CULTURE, BLOOD (SINGLE)
CULTURE: NO GROWTH
Special Requests: ADEQUATE

## 2017-06-29 ENCOUNTER — Ambulatory Visit: Payer: Medicare Other | Admitting: Family

## 2017-07-12 ENCOUNTER — Ambulatory Visit: Admit: 2017-07-12 | Payer: Medicare Other | Admitting: Gastroenterology

## 2017-07-12 SURGERY — ESOPHAGOGASTRODUODENOSCOPY (EGD) WITH PROPOFOL
Anesthesia: General

## 2017-07-18 NOTE — Progress Notes (Signed)
   2017/07/18 0225  Clinical Encounter Type  Visited With Family  Visit Type Death  Referral From Nurse  Consult/Referral To Chaplain  Spiritual Encounters  Spiritual Needs Prayer;Emotional;Grief support   CH received PG to come to PT's RM as she had expired. CH presented to RM and prayed for family and offered grief support.

## 2017-07-18 NOTE — Death Summary Note (Signed)
 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 fib, status post AICD,  hyperlipidemia,  hypertension,  GBS   Brief Hospital Course (including significant findings, care, treatment, and services provided and events leading to death)  Jodi Waters is a 82 y.o. year old female with history of anxiety, cardiac valvular disease status post replacement, congestive heart failure, depression, diabetes, A. fib, status post AICD, hyperlipidemia, hypertension, GBS  mechanical prosthetic valves admitted with 3-4 weeks increasing weakness and SOB. She also had a fall and L elbow fx. 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   * Acute respiratory failure with hypoxia: due to   Acute on chronic diastolic congestive heart failure   * Status post AICD and mechanical heart, A. fib  * Sepsis   Likely due to UTI and prosthetic valve endocarditis  * Acute renal failure  * Hypertension  * Hyperlipidemia  * Hypothyroidism  * Elevated LFTs.  Family chose comfort care, patient passed without discomfort. Pertinent Labs and Studies  Significant Diagnostic Studies Dg Chest 1 View  Result Date: 06/21/2017 CLINICAL DATA:  Respiratory failure due to CHF and sepsis from UTI. EXAM: CHEST 1 VIEW COMPARISON:   07-03-17 FINDINGS: Left-sided cardiac pacemaker unchanged. Lungs are adequately inflated demonstrate mild interval improvement of bilateral perihilar opacification compatible with mild interval improvement of interstitial edema. Small bilateral pleural effusions without significant change. Stable cardiomegaly. Remainder of the exam is unchanged. IMPRESSION: Slight interval improvement of moderate CHF. Electronically Signed   By: Marin Olp M.D.   On: 06/21/2017 08:35   Dg Chest Port 1 View  Result Date: 07/03/17 CLINICAL DATA:  Dyspnea, weakness, shortness of breath EXAM: PORTABLE CHEST 1 VIEW COMPARISON:  CT chest 11/29/2016 FINDINGS: Diffuse bilateral interstitial and alveolar airspace opacities. Small bilateral pleural effusions. No pneumothorax. Stable cardiomegaly. Prior aortic valve replacement. Dual lead cardiac pacemaker is present. Thoracic aortic atherosclerosis. No acute osseous abnormality. IMPRESSION: Findings consistent with CHF. Electronically Signed   By: Kathreen Devoid   On: 2017/07/03 11:19    Microbiology Recent Results (from the past 240 hour(s))  Blood Culture (routine x 2)     Status: Abnormal   Collection Time: 03-Jul-2017 11:03 AM  Result Value Ref Range Status   Specimen Description   Final    BLOOD RIGHT HAND Performed at [facility], [place] [address] [place], [place] [address]    Special Requests   Final    BOTTLES DRAWN AEROBIC AND ANAEROBIC Blood Culture adequate volume Performed at [place] [place], [place] [address]    Culture  Setup Time   Final    GRAM POSITIVE COCCI IN BOTH AEROBIC AND ANAEROBIC BOTTLES CRITICAL RESULT CALLED TO, READ BACK BY AND VERIFIED WITH: MATT MCBANE ON 06/21/17 AT 0354 BY JAG Performed at [facility], [place] [address],  [place], [place] [address]    Culture ENTEROCOCCUS FAECIUM (A)  Final   Report Status  FINAL  Final   Organism ID, Bacteria ENTEROCOCCUS FAECIUM  Final       Susceptibility   Enterococcus faecium - MIC*    AMPICILLIN <=2 SENSITIVE Sensitive     VANCOMYCIN <=0.5 SENSITIVE Sensitive     GENTAMICIN SYNERGY SENSITIVE Sensitive     * ENTEROCOCCUS FAECIUM  Blood Culture (routine x 2)     Status: Abnormal   Collection Time: 07/09/2017 11:03 AM  Result Value Ref Range Status   Specimen Description   Final    BLOOD BLOOD RIGHT FOREARM Performed at [facility], [place] [address] [place], [place] [address]    Special Requests   Final    BOTTLES DRAWN AEROBIC AND ANAEROBIC Blood Culture results may not be optimal due to an excessive volume of blood received in culture bottles Performed at [facility], [address] [place], [place] [address]    Culture  Setup Time   Final    GRAM POSITIVE COCCI IN BOTH AEROBIC AND ANAEROBIC BOTTLES CRITICAL VALUE NOTED.  VALUE IS CONSISTENT WITH PREVIOUSLY REPORTED AND CALLED VALUE. Performed at [facility], [address] [place], [place] [address]    Culture ENTEROCOCCUS FAECIUM (A)  Final   Report Status  FINAL  Final  Blood Culture ID Panel (Reflexed)     Status: Abnormal   Collection Time: 06/26/2017 11:03 AM  Result Value Ref Range Status   Enterococcus species DETECTED (A) NOT DETECTED Final    Comment: CRITICAL RESULT CALLED TO, READ BACK BY AND VERIFIED WITH: MATT MCBANE ON 06/21/17 AT 0354 BY JAG    Vancomycin resistance NOT DETECTED NOT DETECTED Final   Listeria monocytogenes NOT DETECTED NOT DETECTED Final   Staphylococcus species NOT DETECTED NOT DETECTED Final   Staphylococcus aureus NOT DETECTED NOT DETECTED Final   Streptococcus species NOT DETECTED NOT DETECTED Final   Streptococcus agalactiae NOT DETECTED NOT DETECTED Final   Streptococcus pneumoniae NOT DETECTED NOT DETECTED Final   Streptococcus pyogenes NOT DETECTED NOT DETECTED Final   Acinetobacter baumannii NOT DETECTED NOT DETECTED Final   Enterobacteriaceae species NOT DETECTED NOT DETECTED Final    Enterobacter cloacae complex NOT DETECTED NOT DETECTED Final   Escherichia coli NOT DETECTED NOT DETECTED Final   Klebsiella oxytoca NOT DETECTED NOT DETECTED Final   Klebsiella pneumoniae NOT DETECTED NOT DETECTED Final   Proteus species NOT DETECTED NOT DETECTED Final   Serratia marcescens NOT DETECTED NOT DETECTED Final   Haemophilus influenzae NOT DETECTED NOT DETECTED Final   Neisseria meningitidis NOT DETECTED NOT DETECTED Final   Pseudomonas aeruginosa NOT DETECTED NOT DETECTED Final   Candida albicans NOT DETECTED NOT DETECTED Final   Candida glabrata NOT DETECTED NOT DETECTED Final   Candida krusei NOT DETECTED NOT DETECTED Final   Candida parapsilosis NOT DETECTED NOT DETECTED Final   Candida tropicalis NOT DETECTED NOT DETECTED Final    Comment: Performed at [facility], [address] [place], [place] [address]  Urine culture     Status: Abnormal   Collection Time: 07/14/2017  1:44 PM  Result Value Ref Range Status   Specimen Description   Final    URINE, RANDOM Performed at [facility], [address], [place],  [address]    Special Requests   Final    NONE Performed at [facility], [address] [place],  [address]    Culture (  A)  Final    >=100,000 COLONIES/mL STAPHYLOCOCCUS SPECIES (COAGULASE NEGATIVE)   Report Status 06/22/2017 FINAL  Final   Organism ID, Bacteria STAPHYLOCOCCUS SPECIES (COAGULASE NEGATIVE) (A)  Final      Susceptibility   Staphylococcus species (coagulase negative) - MIC*    CIPROFLOXACIN >=8 RESISTANT Resistant     GENTAMICIN <=0.5 SENSITIVE Sensitive     NITROFURANTOIN <=16 SENSITIVE Sensitive     OXACILLIN <=0.25 SENSITIVE Sensitive     TETRACYCLINE <=1 SENSITIVE Sensitive     VANCOMYCIN <=0.5 SENSITIVE Sensitive     TRIMETH/SULFA <=10 SENSITIVE Sensitive     CLINDAMYCIN RESISTANT Resistant     RIFAMPIN <=0.5 SENSITIVE Sensitive     Inducible Clindamycin POSITIVE Resistant     *  >=100,000 COLONIES/mL STAPHYLOCOCCUS SPECIES (COAGULASE NEGATIVE)  Culture, blood (single) w Reflex to ID Panel     Status: None (Preliminary result)   Collection Time: 06/21/17  4:03 PM  Result Value Ref Range Status   Specimen Description BLOOD BLOOD RIGHT WRIST  Final   Special Requests   Final    BOTTLES DRAWN AEROBIC AND ANAEROBIC Blood Culture adequate volume   Culture   Final    NO GROWTH 4 DAYS Performed at [facility], [place] [place], [place] [address]    Report Status PENDING  Incomplete    Lab Basic Metabolic Panel: Recent Labs  Lab 07/07/2017 1103 06/21/17 0625 06/22/17 0343  NA 135 135 133*  K 3.9 4.5 4.6  CL 102 103 102  CO2 20* 21* 24  GLUCOSE 181* 197* 202*  BUN 24* 36* 52*  CREATININE 1.24* 1.80* 2.58*  CALCIUM 8.6* 8.2* 8.3*   Liver Function Tests: Recent Labs  Lab 07/11/2017 1103 06/22/17 0343  AST 30 23  ALT 13* 12*  ALKPHOS 98 77  BILITOT 2.4* 1.5*  PROT 6.8 5.9*  ALBUMIN 3.0* 2.4*   No results for input(s): LIPASE, AMYLASE in the last 168 hours. No results for input(s): AMMONIA in the last 168 hours. CBC: Recent Labs  Lab 07/08/2017 1103 06/21/17 0625 06/22/17 0343  WBC 27.5* 16.5* 13.6*  NEUTROABS 25.6*  --   --   HGB 11.5* 10.1* 10.2*  HCT 35.6 31.4* 31.0*  MCV 86.8 86.3 86.7  PLT 175 131* 112*   Cardiac Enzymes: Recent Labs  Lab 06/17/2017 1103  TROPONINI 0.35*   Sepsis Labs: Recent Labs  Lab 07/14/2017 1103 06/21/17 0625 06/22/17 0343  WBC 27.5* 16.5* 13.6*  LATICACIDVEN 2.9*  --  1.4    Procedures/Operations  none   Cindy Brindisi 06/25/2017, 6:29 PM

## 2017-07-18 DEATH — deceased

## 2017-09-06 ENCOUNTER — Ambulatory Visit: Payer: Medicare Other | Admitting: Urology

## 2019-03-09 IMAGING — CT CT ABD-PELV W/ CM
2 of 5 series · 15 of 46 positions shown, 17 images · IV contrast (APPLIED)
Comparison: Ultrasound 12/01/2016 and chest CT on 11/29/2016

CLINICAL DATA: Severe left hydronephrosis on ultrasound.
Nephrolithiasis.

EXAM:
CT ABDOMEN AND PELVIS WITH CONTRAST
TECHNIQUE: Multidetector CT imaging of the abdomen and pelvis was performed
using the standard protocol following bolus administration of
intravenous contrast.
CONTRAST:  100mL [id number] IOPAMIDOL ([id number]) INJECTION 61%

[Series 2: routine abd/pel with · axial · 0.65mm/px · z∈[-442,-42]mm · 12 of 90 slices shown, 14 images]
[im 5/90  soft-tissue]
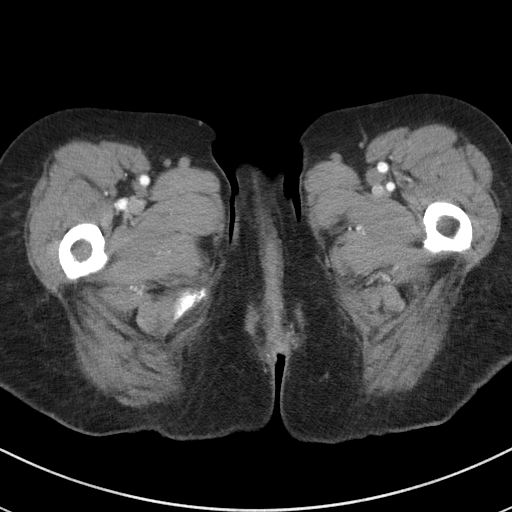
[im 5/90  bone]
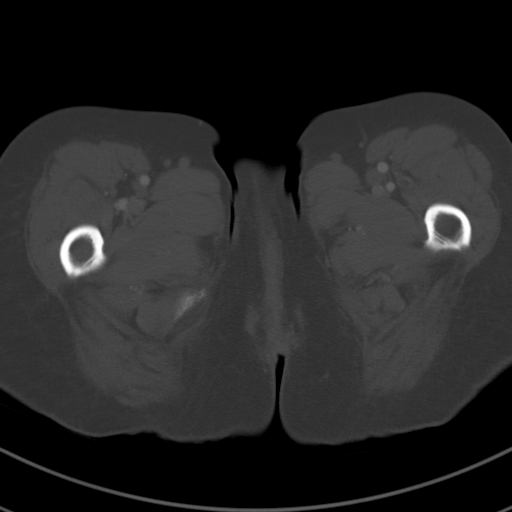
[im 15/90  soft-tissue]
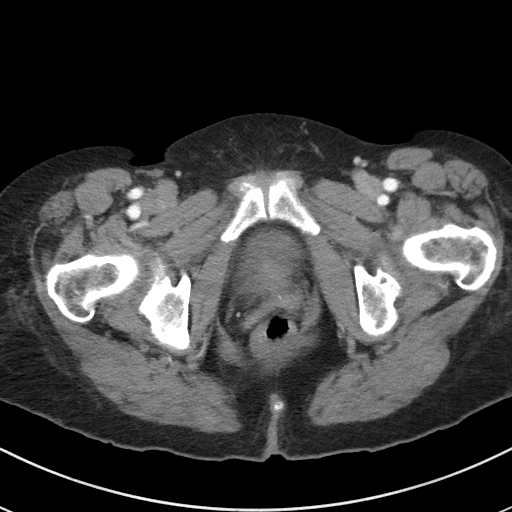
[im 19/90  soft-tissue]
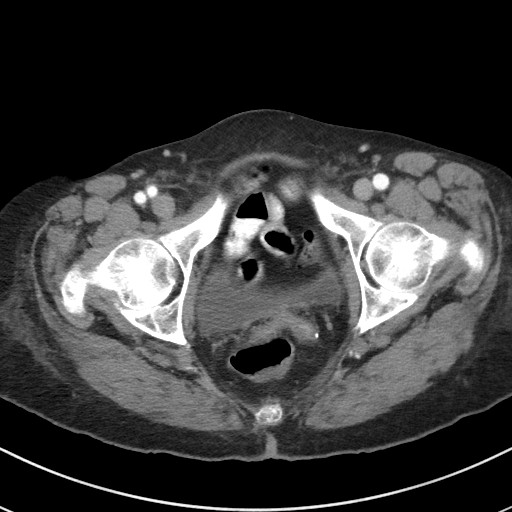
[im 29/90  soft-tissue]
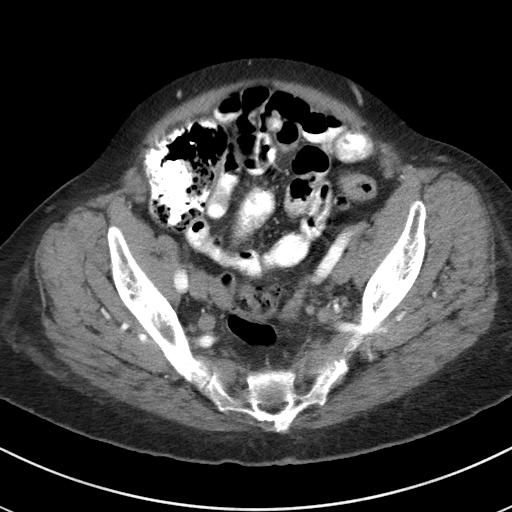
[im 33/90  soft-tissue]
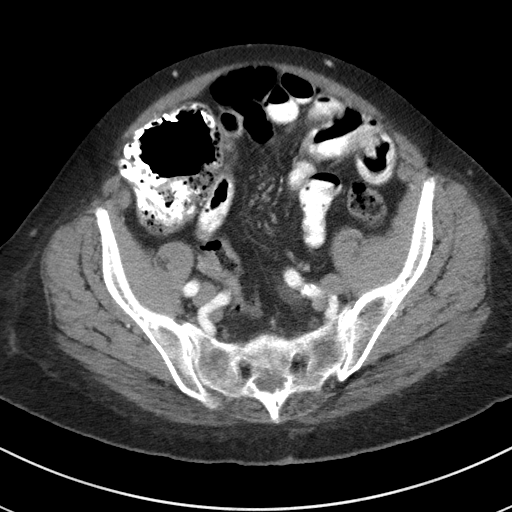
[im 43/90  soft-tissue]
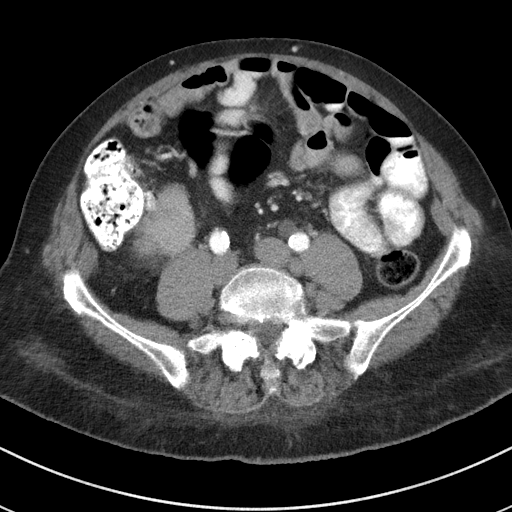
[im 47/90  soft-tissue]
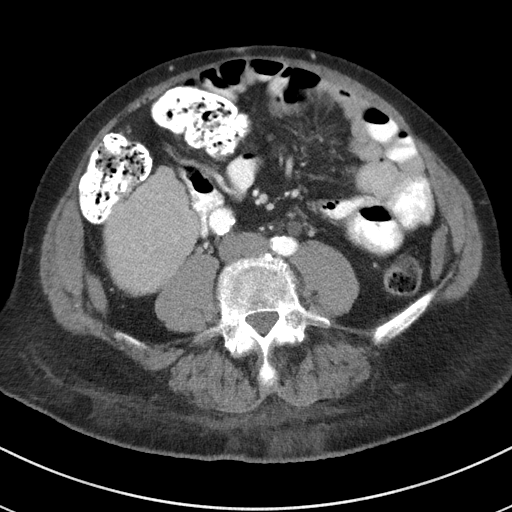
[im 57/90  soft-tissue]
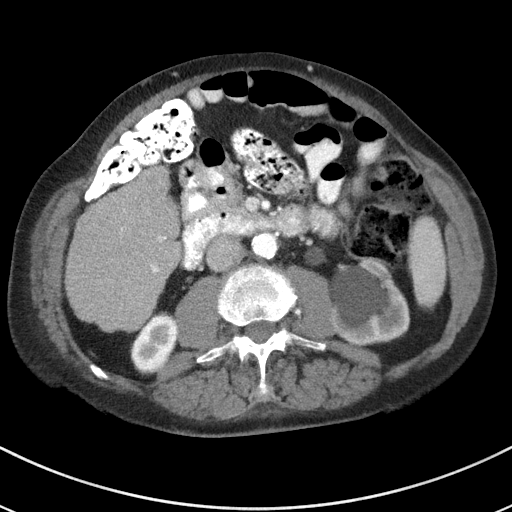
[im 61/90  soft-tissue]
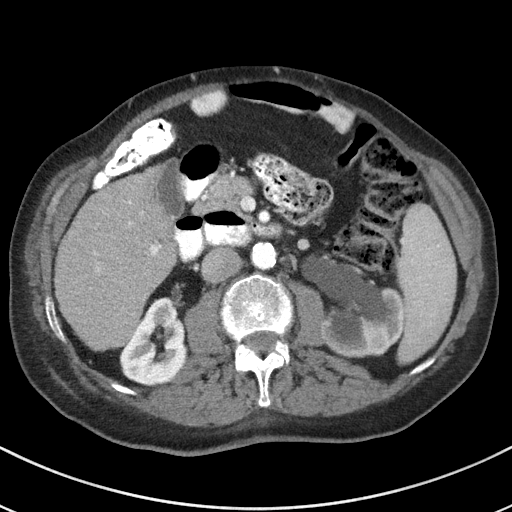
[im 61/90  bone]
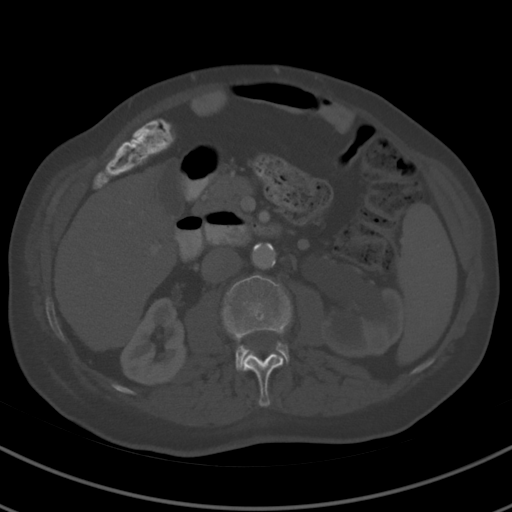
[im 71/90  soft-tissue]
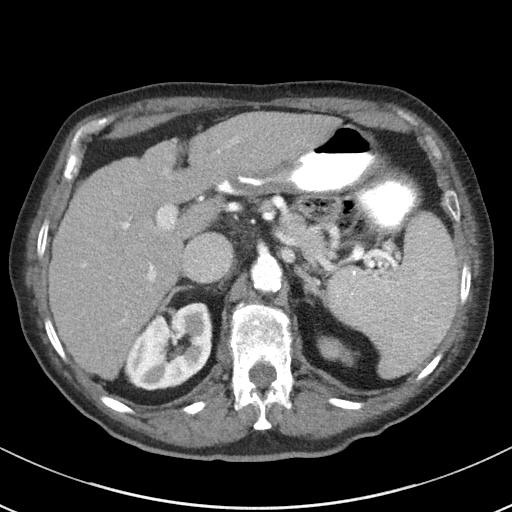
[im 75/90  soft-tissue]
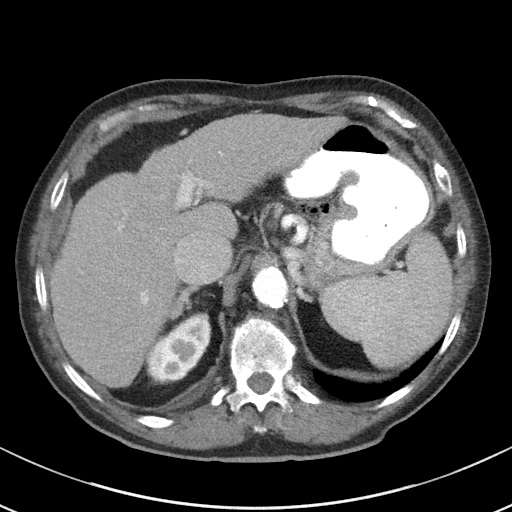
[im 85/90  soft-tissue]
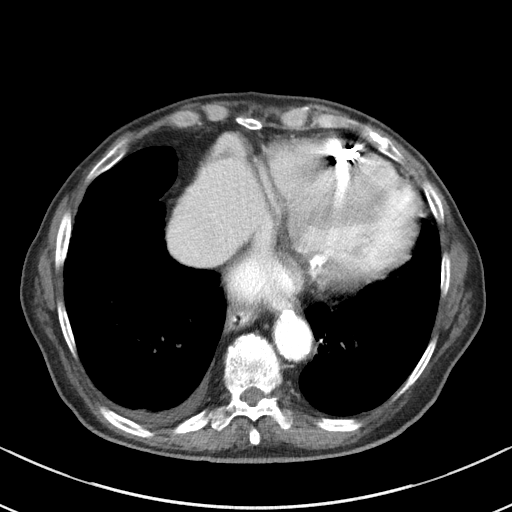

[Series 5: coronal st · coronal · 0.68mm/px · 3 of 92 slices shown]
[im 31/92  soft-tissue]
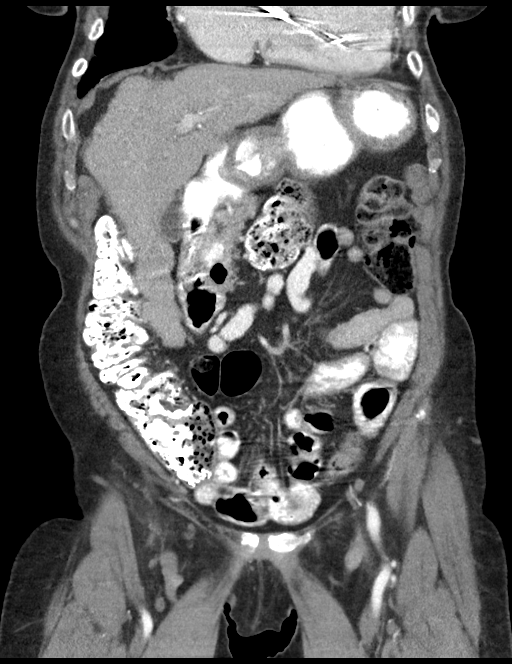
[im 41/92  soft-tissue]
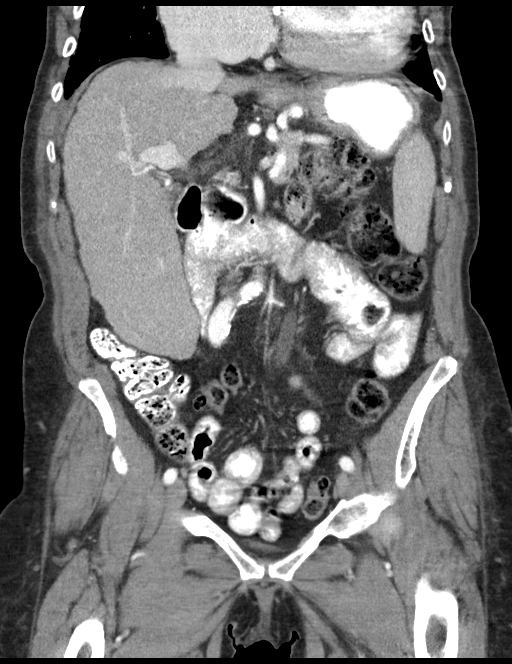
[im 51/92  soft-tissue]
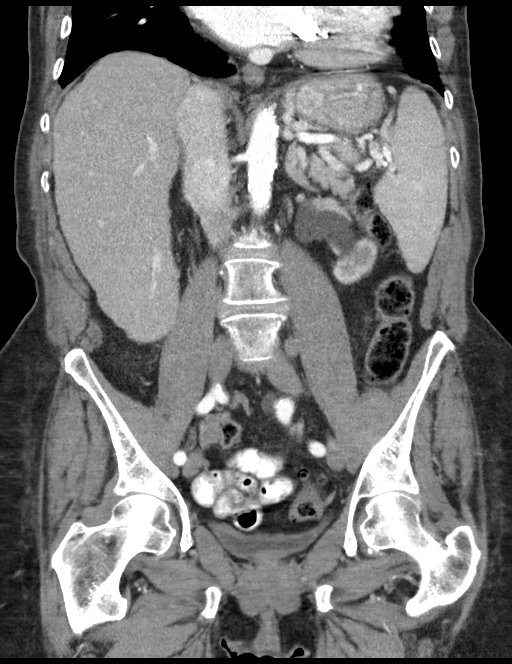

[15 of 46 positions shown; findings below may reference images not displayed]

FINDINGS: Lower Chest: Severe cardiomegaly which is stable. Tiny right pleural
effusion, significantly decreased in size since recent chest CT.
Distention of IVC and hepatic veins, consistent with right heart
insufficiency.

Hepatobiliary: Hepatic cirrhosis. No liver masses identified.
Gallbladder is unremarkable.

Pancreas:  No mass or inflammatory changes.

Spleen: Within normal limits in size and appearance.

Adrenals/Urinary Tract: No masses identified. A few tiny sub-cm
right renal cysts are noted. Two tiny less than 5 mm nonobstructing
calculi are seen in the right kidney however there is no evidence of
right-sided ureteral calculi or hydronephrosis. Severe left
hydroureteronephrosis is seen due to a 5 mm calculus in the distal
left ureter. Unremarkable unopacified urinary bladder.

Stomach/Bowel: No evidence of obstruction, inflammatory process or
abnormal fluid collections. Sigmoid diverticulosis noted, without
evidence of diverticulitis.

Vascular/Lymphatic: No pathologically enlarged lymph nodes. No
abdominal aortic aneurysm. Aortic atherosclerosis. Upper abdominal
and esophageal varices, and recanalization of paraumbilical veins,
consistent with portal venous hypertension.

Reproductive: Prior hysterectomy noted. Adnexal regions are
unremarkable in appearance.

Other:  None.

Musculoskeletal: No suspicious bone lesions identified. L1 vertebral
compression fracture of indeterminate age, which is stable since
recent exam.
IMPRESSION: Severe left hydroureteronephrosis due to 5 mm distal left ureteral
calculus.

Small nonobstructing right renal calculi.

Cirrhosis and findings of portal venous hypertension. No evidence of
hepatic neoplasm.

Severe cardiomegaly with signs of right heart insufficiency.
Significant decrease in size of right pleural effusion since recent
chest CT.

Colonic diverticulosis, without radiographic evidence of
diverticulitis.

L1 vertebral body compression fracture of indeterminate age, but
stable since recent chest CT.
